# Patient Record
Sex: Female | Born: 1935 | Race: White | Hispanic: No | Smoking: Never smoker
Health system: Southern US, Community
[De-identification: ages and names within clinical notes are randomized; demographics above are authoritative.]

## PROBLEM LIST (undated history)

## (undated) DIAGNOSIS — F32A Depression, unspecified: Secondary | ICD-10-CM

## (undated) DIAGNOSIS — F329 Major depressive disorder, single episode, unspecified: Secondary | ICD-10-CM

## (undated) DIAGNOSIS — H9190 Unspecified hearing loss, unspecified ear: Secondary | ICD-10-CM

## (undated) DIAGNOSIS — I10 Essential (primary) hypertension: Secondary | ICD-10-CM

## (undated) DIAGNOSIS — F0390 Unspecified dementia without behavioral disturbance: Secondary | ICD-10-CM

## (undated) HISTORY — PX: PACEMAKER PLACEMENT: SHX43

---
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
## 2018-07-18 ENCOUNTER — Emergency Department (HOSPITAL_COMMUNITY): Payer: Medicare Other

## 2018-07-18 ENCOUNTER — Observation Stay (HOSPITAL_COMMUNITY)
Admission: EM | Admit: 2018-07-18 | Discharge: 2018-07-22 | Disposition: A | Discharge status: Home / Self Care | Payer: Medicare Other | Attending: Internal Medicine | Admitting: Internal Medicine

## 2018-07-18 ENCOUNTER — Other Ambulatory Visit: Payer: Self-pay

## 2018-07-18 ENCOUNTER — Encounter (HOSPITAL_COMMUNITY): Payer: Self-pay

## 2018-07-18 DIAGNOSIS — G9341 Metabolic encephalopathy: Secondary | ICD-10-CM | POA: Diagnosis not present

## 2018-07-18 DIAGNOSIS — I129 Hypertensive chronic kidney disease with stage 1 through stage 4 chronic kidney disease, or unspecified chronic kidney disease: Secondary | ICD-10-CM | POA: Diagnosis not present

## 2018-07-18 DIAGNOSIS — R410 Disorientation, unspecified: Secondary | ICD-10-CM | POA: Diagnosis not present

## 2018-07-18 DIAGNOSIS — Z66 Do not resuscitate: Secondary | ICD-10-CM | POA: Diagnosis not present

## 2018-07-18 DIAGNOSIS — R630 Anorexia: Secondary | ICD-10-CM | POA: Insufficient documentation

## 2018-07-18 DIAGNOSIS — R262 Difficulty in walking, not elsewhere classified: Secondary | ICD-10-CM | POA: Insufficient documentation

## 2018-07-18 DIAGNOSIS — F329 Major depressive disorder, single episode, unspecified: Secondary | ICD-10-CM | POA: Diagnosis not present

## 2018-07-18 DIAGNOSIS — W19XXXA Unspecified fall, initial encounter: Secondary | ICD-10-CM

## 2018-07-18 DIAGNOSIS — M6281 Muscle weakness (generalized): Secondary | ICD-10-CM | POA: Insufficient documentation

## 2018-07-18 DIAGNOSIS — F0390 Unspecified dementia without behavioral disturbance: Secondary | ICD-10-CM

## 2018-07-18 DIAGNOSIS — N390 Urinary tract infection, site not specified: Secondary | ICD-10-CM | POA: Diagnosis present

## 2018-07-18 DIAGNOSIS — N182 Chronic kidney disease, stage 2 (mild): Secondary | ICD-10-CM | POA: Insufficient documentation

## 2018-07-18 DIAGNOSIS — N3001 Acute cystitis with hematuria: Secondary | ICD-10-CM | POA: Diagnosis not present

## 2018-07-18 DIAGNOSIS — Z950 Presence of cardiac pacemaker: Secondary | ICD-10-CM | POA: Diagnosis not present

## 2018-07-18 DIAGNOSIS — X58XXXD Exposure to other specified factors, subsequent encounter: Secondary | ICD-10-CM | POA: Diagnosis not present

## 2018-07-18 DIAGNOSIS — Z9181 History of falling: Secondary | ICD-10-CM | POA: Diagnosis not present

## 2018-07-18 DIAGNOSIS — I10 Essential (primary) hypertension: Secondary | ICD-10-CM

## 2018-07-18 DIAGNOSIS — R1310 Dysphagia, unspecified: Secondary | ICD-10-CM | POA: Diagnosis not present

## 2018-07-18 DIAGNOSIS — Z888 Allergy status to other drugs, medicaments and biological substances status: Secondary | ICD-10-CM | POA: Insufficient documentation

## 2018-07-18 DIAGNOSIS — R110 Nausea: Secondary | ICD-10-CM | POA: Insufficient documentation

## 2018-07-18 DIAGNOSIS — N179 Acute kidney failure, unspecified: Secondary | ICD-10-CM | POA: Insufficient documentation

## 2018-07-18 DIAGNOSIS — S52021D Displaced fracture of olecranon process without intraarticular extension of right ulna, subsequent encounter for closed fracture with routine healing: Secondary | ICD-10-CM | POA: Diagnosis not present

## 2018-07-18 DIAGNOSIS — S52021A Displaced fracture of olecranon process without intraarticular extension of right ulna, initial encounter for closed fracture: Secondary | ICD-10-CM | POA: Diagnosis present

## 2018-07-18 DIAGNOSIS — M62838 Other muscle spasm: Secondary | ICD-10-CM | POA: Diagnosis not present

## 2018-07-18 DIAGNOSIS — Z79899 Other long term (current) drug therapy: Secondary | ICD-10-CM | POA: Insufficient documentation

## 2018-07-18 DIAGNOSIS — H9190 Unspecified hearing loss, unspecified ear: Secondary | ICD-10-CM

## 2018-07-18 HISTORY — DX: Unspecified dementia, unspecified severity, without behavioral disturbance, psychotic disturbance, mood disturbance, and anxiety: F03.90

## 2018-07-18 HISTORY — DX: Essential (primary) hypertension: I10

## 2018-07-18 HISTORY — DX: Unspecified dementia without behavioral disturbance: F03.90

## 2018-07-18 HISTORY — DX: Unspecified hearing loss, unspecified ear: H91.90

## 2018-07-18 LAB — URINALYSIS, ROUTINE W REFLEX MICROSCOPIC
Bilirubin Urine: NEGATIVE
Glucose, UA: NEGATIVE mg/dL
Hgb urine dipstick: NEGATIVE
Ketones, ur: NEGATIVE mg/dL
Nitrite: POSITIVE — AB
Protein, ur: 30 mg/dL — AB
Specific Gravity, Urine: 1.02 (ref 1.005–1.030)
WBC, UA: >50 WBC/hpf — ABNORMAL HIGH (ref 0–5)
pH: 5 (ref 5.0–8.0)

## 2018-07-18 LAB — BASIC METABOLIC PANEL
Anion gap: 9 (ref 5–15)
BUN: 21 mg/dL (ref 8–23)
CO2: 27 mmol/L (ref 22–32)
CREATININE: 1.12 mg/dL — AB (ref 0.44–1.00)
Calcium: 9.5 mg/dL (ref 8.9–10.3)
Chloride: 108 mmol/L (ref 98–111)
GFR calc Af Amer: 50 mL/min — ABNORMAL LOW (ref >60)
GFR calc non Af Amer: 43 mL/min — ABNORMAL LOW (ref >60)
Glucose, Bld: 112 mg/dL — ABNORMAL HIGH (ref 70–99)
Potassium: 4.4 mmol/L (ref 3.5–5.1)
Sodium: 144 mmol/L (ref 135–145)

## 2018-07-18 LAB — CBC
HCT: 43.8 % (ref 36.0–46.0)
Hemoglobin: 13.6 g/dL (ref 12.0–15.0)
MCH: 31.8 pg (ref 26.0–34.0)
MCHC: 31.1 g/dL (ref 30.0–36.0)
MCV: 102.3 fL — ABNORMAL HIGH (ref 80.0–100.0)
Platelets: 347 10*3/uL (ref 150–400)
RBC: 4.28 MIL/uL (ref 3.87–5.11)
RDW: 12.9 % (ref 11.5–15.5)
WBC: 13.1 10*3/uL — ABNORMAL HIGH (ref 4.0–10.5)
nRBC: 0 % (ref 0.0–0.2)

## 2018-07-18 MED ORDER — ONDANSETRON HCL 4 MG/2ML IJ SOLN: 4.0000 mg | Freq: Four times a day (QID) | INTRAMUSCULAR | Status: DC | PRN

## 2018-07-18 MED ORDER — ATENOLOL 25 MG PO TABS
25.0000 mg | ORAL_TABLET | Freq: Every day | ORAL | Status: DC
  Administered 2018-07-19 – 2018-07-22 (×4): 25 mg via ORAL
  Filled 2018-07-18 (×5): qty 1

## 2018-07-18 MED ORDER — SENNOSIDES-DOCUSATE SODIUM 8.6-50 MG PO TABS: 1.0000 | ORAL_TABLET | Freq: Every evening | ORAL | Status: DC | PRN

## 2018-07-18 MED ORDER — ONDANSETRON HCL 4 MG PO TABS: 4.0000 mg | ORAL_TABLET | Freq: Four times a day (QID) | ORAL | Status: DC | PRN

## 2018-07-18 MED ORDER — HEPARIN SODIUM (PORCINE) 5000 UNIT/ML IJ SOLN
5000.0000 [IU] | Freq: Three times a day (TID) | INTRAMUSCULAR | Status: DC
  Administered 2018-07-18 – 2018-07-22 (×10): 5000 [IU] via SUBCUTANEOUS
  Filled 2018-07-18 (×9): qty 1

## 2018-07-18 MED ORDER — PAROXETINE HCL 20 MG PO TABS
30.0000 mg | ORAL_TABLET | Freq: Every day | ORAL | Status: DC
  Administered 2018-07-19 – 2018-07-22 (×4): 30 mg via ORAL
  Filled 2018-07-18 (×5): qty 1

## 2018-07-18 MED ORDER — SODIUM CHLORIDE 0.9 % IV SOLN
1.0000 g | Freq: Once | INTRAVENOUS | Status: AC
  Administered 2018-07-18: 1 g via INTRAVENOUS
  Filled 2018-07-18: qty 10

## 2018-07-18 MED ORDER — ENSURE MAX PROTEIN PO LIQD
11.0000 [oz_av] | Freq: Every day | ORAL | Status: DC
  Administered 2018-07-18 – 2018-07-22 (×5): 11 [oz_av] via ORAL
  Filled 2018-07-18: qty 330

## 2018-07-18 MED ORDER — DOXYLAMINE SUCCINATE (SLEEP) 25 MG PO TABS
1.0000 | ORAL_TABLET | Freq: Every day | ORAL | Status: DC
  Administered 2018-07-18 – 2018-07-21 (×4): 25 mg via ORAL
  Filled 2018-07-18 (×4): qty 1

## 2018-07-18 MED ORDER — SODIUM CHLORIDE 0.9 % IV BOLUS
500.0000 mL | Freq: Once | INTRAVENOUS | Status: AC
  Administered 2018-07-18: 500 mL via INTRAVENOUS

## 2018-07-18 MED ORDER — METHOCARBAMOL 500 MG PO TABS: 500.0000 mg | ORAL_TABLET | Freq: Three times a day (TID) | ORAL | Status: DC | PRN

## 2018-07-18 MED ORDER — SODIUM CHLORIDE 0.9 % IV SOLN
INTRAVENOUS | Status: DC
  Administered 2018-07-18 – 2018-07-21 (×4): via INTRAVENOUS

## 2018-07-18 MED ORDER — ACETAMINOPHEN 650 MG RE SUPP: 650.0000 mg | Freq: Four times a day (QID) | RECTAL | Status: DC | PRN

## 2018-07-18 MED ORDER — FENTANYL CITRATE (PF) 100 MCG/2ML IJ SOLN
25.0000 ug | Freq: Once | INTRAMUSCULAR | Status: AC
  Administered 2018-07-18: 25 ug via INTRAMUSCULAR
  Filled 2018-07-18: qty 2

## 2018-07-18 MED ORDER — ACETAMINOPHEN 325 MG PO TABS
650.0000 mg | ORAL_TABLET | Freq: Four times a day (QID) | ORAL | Status: DC | PRN
  Administered 2018-07-21: 650 mg via ORAL
  Filled 2018-07-18: qty 2

## 2018-07-18 MED ORDER — SODIUM CHLORIDE 0.9 % IV SOLN
1.0000 g | INTRAVENOUS | Status: DC
  Administered 2018-07-19 – 2018-07-21 (×3): 1 g via INTRAVENOUS
  Filled 2018-07-18 (×3): qty 1

## 2018-07-18 MED ORDER — OXYCODONE-ACETAMINOPHEN 5-325 MG PO TABS
1.0000 | ORAL_TABLET | ORAL | Status: DC | PRN
  Administered 2018-07-20 (×2): 1 via ORAL
  Filled 2018-07-18 (×2): qty 1

## 2018-07-18 MED ORDER — HYDRALAZINE HCL 20 MG/ML IJ SOLN: 5.0000 mg | INTRAMUSCULAR | Status: DC | PRN

## 2018-07-18 NOTE — ED Triage Notes (Signed)
Patient arrived via [facility] from home. Patient is currently at baseline per family at scene due to patient having Hx of dementia, and difficulty hearing. Patient has right elbow injury, unsure if patient had fall. Per family, home health was at home and gave bath and no bruising or pain noted. Patient last night had decrease in appetite and has had about 4 insure drinks since then. Patient also has pain in right elbow and bruising.

## 2018-07-18 NOTE — ED Provider Notes (Signed)
Lives at home w/ home health, at baseline pt is ambulatory. Family noticed she wasn't using her right arm as much, and had decreased appetite. Family unsure of any falls or trauma and pt demented and unable to contribute to history. EMS noted that toilet paper holder was at the level of the elbow in the home and wondered if that could be the source of trauma.   X-rays of humerus and forearm ordered by prior care team, CT head and neck added as well given pt nonverbal and unsure of trauma.  Thus far imaging shows an olecranon process fracture.  Plan: Follow-up additional images and then discussed with orthopedics.  Labs Reviewed  BASIC METABOLIC PANEL - Abnormal; Notable for the following components:      Result Value   Glucose, Bld 112 (*)    Creatinine, Ser 1.12 (*)    GFR calc non Af Amer 43 (*)    GFR calc Af Amer 50 (*)    All other components within normal limits  CBC - Abnormal; Notable for the following components:   WBC 13.1 (*)    MCV 102.3 (*)    All other components within normal limits  URINALYSIS, ROUTINE W REFLEX MICROSCOPIC - Abnormal; Notable for the following components:   Color, Urine AMBER (*)    APPearance CLOUDY (*)    Protein, ur 30 (*)    Nitrite POSITIVE (*)    Leukocytes,Ua LARGE (*)    WBC, UA >50 (*)    Bacteria, UA MANY (*)    Non Squamous Epithelial 0-5 (*)    All other components within normal limits    Dg Elbow Complete Right  Result Date: 07/18/2018 CLINICAL DATA:  History of dementia, now with right elbow pain and edema. EXAM: RIGHT ELBOW - COMPLETE 3+ VIEW COMPARISON:  Right humerus and forearm radiographs-earlier same day FINDINGS: There is an acute displaced avulsion fracture of the olecranon process with associated foreshortening and adjacent soft tissue swelling. No additional fractures identified given obliquity. No radiopaque foreign body. IMPRESSION: 1. Acute minimally displaced avulsion fracture of the olecranon process with associated  foreshortening. 2. No additional fractures identified. Electronically Signed   By: Simonne Come M.D.   On: 07/18/2018 16:28   Dg Forearm Right  Result Date: 07/18/2018 CLINICAL DATA:  History of dementia, now with right upper extremity edema. EXAM: RIGHT FOREARM - 2 VIEW COMPARISON:  None. FINDINGS: There is an acute minimally displaced avulsion fracture involving the olecranon process with associated foreshortening and adjacent soft tissue swelling. No radiopaque foreign body. No additional fracture identified within the mid and distal aspects of the radius or ulna. Mild degenerative change involving the STT joints of the base of the thumb, incompletely evaluated. IMPRESSION: Acute, minimally displaced avulsion fracture of the olecranon process. Further evaluation with dedicated elbow radiographs could be performed as indicated. Electronically Signed   By: Simonne Come M.D.   On: 07/18/2018 15:41   Ct Head Wo Contrast  Result Date: 07/18/2018 CLINICAL DATA:  Trauma EXAM: CT HEAD WITHOUT CONTRAST CT CERVICAL SPINE WITHOUT CONTRAST TECHNIQUE: Multidetector CT imaging of the head and cervical spine was performed following the standard protocol without intravenous contrast. Multiplanar CT image reconstructions of the cervical spine were also generated. COMPARISON:  None. FINDINGS: CT HEAD FINDINGS Brain: No evidence of acute infarction, hemorrhage, hydrocephalus, extra-axial collection or mass lesion/mass effect. Periventricular white matter disease and global volume loss. Vascular: No hyperdense vessel or unexpected calcification. Skull: Normal. Negative for fracture or focal lesion.  Sinuses/Orbits: No acute finding. Other: None. CT CERVICAL SPINE FINDINGS Alignment: Normal. Skull base and vertebrae: There is a subtle, likely acute superior endplate deformity of C7 without significant fracture fragment pulsion. No primary bone lesion or focal pathologic process. Moderate to severe multilevel facet degenerative  disease. Soft tissues and spinal canal: No prevertebral fluid or swelling. No visible canal hematoma. Disc levels: Moderate multilevel disc and facet degenerative disease, worst at C3 through C5. Upper chest: Negative. Other: None. IMPRESSION: 1. No acute intracranial pathology. Small-vessel white matter disease and volume loss in keeping with advanced patient age. 2. Subtle, likely acute superior endplate deformity of C7 without significant fracture fragment pulsion. Correlate for referable pain. MRI may be used to further evaluate for marrow edema and fracture acuity if desired. Electronically Signed   By: Lauralyn PrimesAlex  Bibbey M.D.   On: 07/18/2018 16:24   Ct Cervical Spine Wo Contrast  Result Date: 07/18/2018 CLINICAL DATA:  Trauma EXAM: CT HEAD WITHOUT CONTRAST CT CERVICAL SPINE WITHOUT CONTRAST TECHNIQUE: Multidetector CT imaging of the head and cervical spine was performed following the standard protocol without intravenous contrast. Multiplanar CT image reconstructions of the cervical spine were also generated. COMPARISON:  None. FINDINGS: CT HEAD FINDINGS Brain: No evidence of acute infarction, hemorrhage, hydrocephalus, extra-axial collection or mass lesion/mass effect. Periventricular white matter disease and global volume loss. Vascular: No hyperdense vessel or unexpected calcification. Skull: Normal. Negative for fracture or focal lesion. Sinuses/Orbits: No acute finding. Other: None. CT CERVICAL SPINE FINDINGS Alignment: Normal. Skull base and vertebrae: There is a subtle, likely acute superior endplate deformity of C7 without significant fracture fragment pulsion. No primary bone lesion or focal pathologic process. Moderate to severe multilevel facet degenerative disease. Soft tissues and spinal canal: No prevertebral fluid or swelling. No visible canal hematoma. Disc levels: Moderate multilevel disc and facet degenerative disease, worst at C3 through C5. Upper chest: Negative. Other: None. IMPRESSION: 1.  No acute intracranial pathology. Small-vessel white matter disease and volume loss in keeping with advanced patient age. 2. Subtle, likely acute superior endplate deformity of C7 without significant fracture fragment pulsion. Correlate for referable pain. MRI may be used to further evaluate for marrow edema and fracture acuity if desired. Electronically Signed   By: Lauralyn PrimesAlex  Bibbey M.D.   On: 07/18/2018 16:24   Dg Chest Portable 1 View  Result Date: 07/18/2018 CLINICAL DATA:  Weakness EXAM: PORTABLE CHEST 1 VIEW COMPARISON:  None. FINDINGS: Two lead left subclavian pacemaker is noted with lead tips overlying the right atrium and right ventricle. Mild cardiomegaly. Minimally tortuous atherosclerotic thoracic aorta. Otherwise normal mediastinal contour. No pneumothorax. No pleural effusion. Lungs appear clear, with no acute consolidative airspace disease and no pulmonary edema. IMPRESSION: Mild cardiomegaly without pulmonary edema. No active pulmonary disease. Electronically Signed   By: Delbert PhenixJason A Poff M.D.   On: 07/18/2018 20:04   Dg Humerus Right  Result Date: 07/18/2018 CLINICAL DATA:  History of dementia, now with deformity involving the right arm. EXAM: RIGHT HUMERUS - 2+ VIEW COMPARISON:  Right forearm radiographs-earlier same day FINDINGS: There is an acute minimally displaced avulsion fracture involving the olecranon process with associated foreshortening and adjacent soft tissue swelling. No radiopaque foreign body. No additional fracture identified within the proximal humerus. IMPRESSION: Acute, minimally displaced avulsion fracture of the olecranon process. Further evaluation with dedicated elbow radiographs could be performed as indicated. Electronically Signed   By: Simonne ComeJohn  Watts M.D.   On: 07/18/2018 15:40    MDM  Patient presents to the emergency  department for evaluation of right arm pain found to have an acutely displaced olecranon process fracture, no other fractures noted within the right arm.   Given that it is unclear how patient sustained trauma CT of the cervical spine and head ordered as well.  C-spine shows a subtle endplate deformity of C7 without any significant fracture fragment potion, patient does not have focal tenderness in this area and is moving all extremities do not feel that this will require further evaluation.  No acute intracranial pathology noted.  Olecranon process fracture on dominant arm will likely have severe impact on patient's functioning she is demented at baseline but uses this arm for eating and to help transfer.  Case discussed with Dr. [name] with orthopedics who recommends placing arm in a posterior splint at 90 degrees with sling.  Not recommend operative management at this time.  Discussed results with patient's son-in-law who is at the bedside, I do not feel that patient would be safe to go back home as she will need significantly increased assistance they have home health at home but they are only there intermittently to check on patient.  Will check basic labs and urinalysis to assess for any source of infection causing increased weakness and fall risk if this is found patient can be admitted but otherwise patient will need social work consult for SNF placement.  Discussed with patient's daughter over the phone as well who is in agreement with this plan.  Patient's daughter is her primary caretaker is out of town due to a death in the family.  Labs show a mild leukocytosis of 13.1, normal hemoglobin, aside from glucose of 112 no acute electrolyte derangements, creatinine of 1.12, no prior available for comparison.  Urinalysis consistent with infection shows positive nitrates and leukocytes with greater than 50 WBCs and many bacteria, this was collected via I&O cath.  Urine culture collected.  Will start patient on IV Rocephin and plan for hospitalist admission.  Updated Dr. [name] on plan for admission he will plan to see the patient during rounds tomorrow  morning but at this time does not recommend surgical intervention given patient's age, she has many risk factors for not recovering well from operative management.  Case discussed with Dr. Clyde Lundborg [facility] hospitalist who will see and admit the patient.  Case discussed with patient's son-in-law who is in agreement with this plan.  Final diagnoses:  Closed fracture of right olecranon process, initial encounter  Acute cystitis with hematuria       Dartha Lodge, PA-C 07/18/18 2341    Mancel Bale, MD 07/19/18 [phone]

## 2018-07-18 NOTE — H&P (Signed)
History and Physical    Casey Lam RFX:[id number] DOB: [birth year] DOA: 07/18/2018  Referring MD/NP/PA:   PCP: [name], MD   Patient coming from:  The patient is coming from home.  At baseline, pt is dependent for most of ADL.        Chief Complaint: right elbow pain  HPI: Casey Lam is a 85 y.o. female with medical history significant of hypertension, depression, dementia, CKD stage II, who presents with right elbow pain.  Per his son-in law, pt lives at home with family, with daily home health help. At baseline, patient knows family members, but most of the time is not orientated to the place and time.  Most of time she is ambulatory. Pt was noted to have right elbow pain today. She is not using her right arm as much. She also has decreased appetite and is  more disorientated.  Not sure if patient fell, but possible. EMS noted that toilet paper holder was at the level of the elbow in the home and wondered if that could be the source of trauma. Pt is using diaper.  Not sure if she has any symptoms for UTI.  She does not seem to have chest pain, cough, shortness of breath.  No active nausea vomiting, diarrhea noted.  Does not seem to have abdominal pain.  ED Course: pt was found to have WBC 13.1, positive urinalysis for UTI (cloudy appearance, large amount of leukocyte, positive nitrite, many bacteria, WBC 50), worsening renal function, temperature normal, no tachycardia, oxygen saturation 90 to 96% on room air.  Chest x-ray with cardiomegaly without infiltration.  CT head is negative for acute intracranial abnormalities.  X-ray of right elbow, right forearm and right humerus showed acute minimally displaced avulsion fracture of her left olecranon process.  Patient is admitted to MedSurg bed for observation.  Orthopedic surgeon, Dr. [name] was consulted by EDP.  CT of C-spin showed: subtle, likely acute superior endplate deformity of C7 without significant fracture fragment  pulsion.    Review of Systems: Could not be reviewed due to dementia  Allergy:  Allergies  Allergen Reactions  . Lorazepam Other (See Comments)    Past Medical History:  Diagnosis Date  . Dementia (HCC) 07/18/2018  . Hearing loss 07/18/2018  . Hypertension 07/18/2018    Past Surgical History:  Procedure Laterality Date  . PACEMAKER PLACEMENT      Social History:  reports that she has never smoked. She has never used smokeless tobacco. She reports previous alcohol use. She reports that she does not use drugs.  Family History:  Family History  Problem Relation Age of Onset  . Dementia Sister   . Dementia Brother      Prior to Admission medications   Medication Sig Start Date End Date Taking? Authorizing Provider  atenolol (TENORMIN) 25 MG tablet Take 25 mg by mouth daily. 05/17/18  Yes [provider]  doxylamine, Sleep, (UNISOM) 25 MG tablet Take 1 tablet by mouth at bedtime.   Yes [provider]  PARoxetine (PAXIL) 30 MG tablet Take 30 mg by mouth daily. 05/17/18  Yes [provider]    Physical Exam: Vitals:   07/18/18 1900 07/18/18 1905 07/18/18 1930 07/18/18 2100  BP: 127/86 127/86 132/61 126/82  Pulse: 80 80 83 77  Resp:  18 17 17   Temp:   97.8 F (36.6 C) 97.8 F (36.6 C)  TempSrc:   Axillary Axillary  SpO2: 93% 93% 96% 96%  Weight:  Height:       General: Not in acute distress HEENT:       Eyes: PERRL, EOMI, no scleral icterus.       ENT: No discharge from the ears and nose, no pharynx injection, no tonsillar enlargement.        Neck: No JVD, no bruit, no mass felt. Heme: No neck lymph node enlargement. Cardiac: S1/S2, RRR, No murmurs, No gallops or rubs. Respiratory: No rales, wheezing, rhonchi or rubs. GI: Soft, nondistended, nontender, no rebound pain, no organomegaly, BS present. GU: No hematuria Ext: No pitting leg edema bilaterally. 2+DP/PT pulse bilaterally. Musculoskeletal: has tenderness in right elbow, splint  was placed.  Skin: No rashes.  Neuro: Alert, not oriented X3, cranial nerves II-XII grossly intact, moves all extremities. Psych: Patient is not psychotic, no suicidal or hemocidal ideation.  Labs on Admission: I have personally reviewed following labs and imaging studies  CBC: Recent Labs  Lab 07/18/18 1710  WBC 13.1*  HGB 13.6  HCT 43.8  MCV 102.3*  PLT 347   Basic Metabolic Panel: Recent Labs  Lab 07/18/18 1710  NA 144  K 4.4  CL 108  CO2 27  GLUCOSE 112*  BUN 21  CREATININE 1.12*  CALCIUM 9.5   GFR: Estimated Creatinine Clearance: 36.7 mL/min (A) (by C-G formula based on SCr of 1.12 mg/dL (H)). Liver Function Tests: No results for input(s): AST, ALT, ALKPHOS, BILITOT, PROT, ALBUMIN in the last 168 hours. No results for input(s): LIPASE, AMYLASE in the last 168 hours. No results for input(s): AMMONIA in the last 168 hours. Coagulation Profile: No results for input(s): INR, PROTIME in the last 168 hours. Cardiac Enzymes: No results for input(s): CKTOTAL, CKMB, CKMBINDEX, TROPONINI in the last 168 hours. BNP (last 3 results) No results for input(s): PROBNP in the last 8760 hours. HbA1C: No results for input(s): HGBA1C in the last 72 hours. CBG: No results for input(s): GLUCAP in the last 168 hours. Lipid Profile: No results for input(s): CHOL, HDL, LDLCALC, TRIG, CHOLHDL, LDLDIRECT in the last 72 hours. Thyroid Function Tests: No results for input(s): TSH, T4TOTAL, FREET4, T3FREE, THYROIDAB in the last 72 hours. Anemia Panel: No results for input(s): VITAMINB12, FOLATE, FERRITIN, TIBC, IRON, RETICCTPCT in the last 72 hours. Urine analysis:    Component Value Date/Time   COLORURINE AMBER (A) 07/18/2018 1710   APPEARANCEUR CLOUDY (A) 07/18/2018 1710   LABSPEC 1.020 07/18/2018 1710   PHURINE 5.0 07/18/2018 1710   GLUCOSEU NEGATIVE 07/18/2018 1710   HGBUR NEGATIVE 07/18/2018 1710   BILIRUBINUR NEGATIVE 07/18/2018 1710   KETONESUR NEGATIVE 07/18/2018 1710    PROTEINUR 30 (A) 07/18/2018 1710   NITRITE POSITIVE (A) 07/18/2018 1710   LEUKOCYTESUR LARGE (A) 07/18/2018 1710   Sepsis Labs: @LABRCNTIP (procalcitonin:4,lacticidven:4) )No results found for this or any previous visit (from the past 240 hour(s)).   Radiological Exams on Admission: Dg Elbow Complete Right  Result Date: 07/18/2018 CLINICAL DATA:  History of dementia, now with right elbow pain and edema. EXAM: RIGHT ELBOW - COMPLETE 3+ VIEW COMPARISON:  Right humerus and forearm radiographs-earlier same day FINDINGS: There is an acute displaced avulsion fracture of the olecranon process with associated foreshortening and adjacent soft tissue swelling. No additional fractures identified given obliquity. No radiopaque foreign body. IMPRESSION: 1. Acute minimally displaced avulsion fracture of the olecranon process with associated foreshortening. 2. No additional fractures identified. Electronically Signed   By: Simonne Come M.D.   On: 07/18/2018 16:28   Dg Forearm Right  Result Date:  07/18/2018 CLINICAL DATA:  History of dementia, now with right upper extremity edema. EXAM: RIGHT FOREARM - 2 VIEW COMPARISON:  None. FINDINGS: There is an acute minimally displaced avulsion fracture involving the olecranon process with associated foreshortening and adjacent soft tissue swelling. No radiopaque foreign body. No additional fracture identified within the mid and distal aspects of the radius or ulna. Mild degenerative change involving the STT joints of the base of the thumb, incompletely evaluated. IMPRESSION: Acute, minimally displaced avulsion fracture of the olecranon process. Further evaluation with dedicated elbow radiographs could be performed as indicated. Electronically Signed   By: Simonne Come M.D.   On: 07/18/2018 15:41   Ct Head Wo Contrast  Result Date: 07/18/2018 CLINICAL DATA:  Trauma EXAM: CT HEAD WITHOUT CONTRAST CT CERVICAL SPINE WITHOUT CONTRAST TECHNIQUE: Multidetector CT imaging of the head  and cervical spine was performed following the standard protocol without intravenous contrast. Multiplanar CT image reconstructions of the cervical spine were also generated. COMPARISON:  None. FINDINGS: CT HEAD FINDINGS Brain: No evidence of acute infarction, hemorrhage, hydrocephalus, extra-axial collection or mass lesion/mass effect. Periventricular white matter disease and global volume loss. Vascular: No hyperdense vessel or unexpected calcification. Skull: Normal. Negative for fracture or focal lesion. Sinuses/Orbits: No acute finding. Other: None. CT CERVICAL SPINE FINDINGS Alignment: Normal. Skull base and vertebrae: There is a subtle, likely acute superior endplate deformity of C7 without significant fracture fragment pulsion. No primary bone lesion or focal pathologic process. Moderate to severe multilevel facet degenerative disease. Soft tissues and spinal canal: No prevertebral fluid or swelling. No visible canal hematoma. Disc levels: Moderate multilevel disc and facet degenerative disease, worst at C3 through C5. Upper chest: Negative. Other: None. IMPRESSION: 1. No acute intracranial pathology. Small-vessel white matter disease and volume loss in keeping with advanced patient age. 2. Subtle, likely acute superior endplate deformity of C7 without significant fracture fragment pulsion. Correlate for referable pain. MRI may be used to further evaluate for marrow edema and fracture acuity if desired. Electronically Signed   By: Lauralyn Primes M.D.   On: 07/18/2018 16:24   Ct Cervical Spine Wo Contrast  Result Date: 07/18/2018 CLINICAL DATA:  Trauma EXAM: CT HEAD WITHOUT CONTRAST CT CERVICAL SPINE WITHOUT CONTRAST TECHNIQUE: Multidetector CT imaging of the head and cervical spine was performed following the standard protocol without intravenous contrast. Multiplanar CT image reconstructions of the cervical spine were also generated. COMPARISON:  None. FINDINGS: CT HEAD FINDINGS Brain: No evidence of acute  infarction, hemorrhage, hydrocephalus, extra-axial collection or mass lesion/mass effect. Periventricular white matter disease and global volume loss. Vascular: No hyperdense vessel or unexpected calcification. Skull: Normal. Negative for fracture or focal lesion. Sinuses/Orbits: No acute finding. Other: None. CT CERVICAL SPINE FINDINGS Alignment: Normal. Skull base and vertebrae: There is a subtle, likely acute superior endplate deformity of C7 without significant fracture fragment pulsion. No primary bone lesion or focal pathologic process. Moderate to severe multilevel facet degenerative disease. Soft tissues and spinal canal: No prevertebral fluid or swelling. No visible canal hematoma. Disc levels: Moderate multilevel disc and facet degenerative disease, worst at C3 through C5. Upper chest: Negative. Other: None. IMPRESSION: 1. No acute intracranial pathology. Small-vessel white matter disease and volume loss in keeping with advanced patient age. 2. Subtle, likely acute superior endplate deformity of C7 without significant fracture fragment pulsion. Correlate for referable pain. MRI may be used to further evaluate for marrow edema and fracture acuity if desired. Electronically Signed   By: Erasmo Score.D.  On: 07/18/2018 16:24   Dg Chest Portable 1 View  Result Date: 07/18/2018 CLINICAL DATA:  Weakness EXAM: PORTABLE CHEST 1 VIEW COMPARISON:  None. FINDINGS: Two lead left subclavian pacemaker is noted with lead tips overlying the right atrium and right ventricle. Mild cardiomegaly. Minimally tortuous atherosclerotic thoracic aorta. Otherwise normal mediastinal contour. No pneumothorax. No pleural effusion. Lungs appear clear, with no acute consolidative airspace disease and no pulmonary edema. IMPRESSION: Mild cardiomegaly without pulmonary edema. No active pulmonary disease. Electronically Signed   By: Delbert PhenixJason A Poff M.D.   On: 07/18/2018 20:04   Dg Humerus Right  Result Date: 07/18/2018 CLINICAL DATA:   History of dementia, now with deformity involving the right arm. EXAM: RIGHT HUMERUS - 2+ VIEW COMPARISON:  Right forearm radiographs-earlier same day FINDINGS: There is an acute minimally displaced avulsion fracture involving the olecranon process with associated foreshortening and adjacent soft tissue swelling. No radiopaque foreign body. No additional fracture identified within the proximal humerus. IMPRESSION: Acute, minimally displaced avulsion fracture of the olecranon process. Further evaluation with dedicated elbow radiographs could be performed as indicated. Electronically Signed   By: Simonne ComeJohn  Watts M.D.   On: 07/18/2018 15:40     EKG: Not done in ED, will get one.   Assessment/Plan Principal Problem:   Fracture of olecranon process of right ulna Active Problems:   Dementia (HCC)   Hypertension   UTI (urinary tract infection)   Acute renal failure superimposed on stage 2 chronic kidney disease (HCC)   Fall   Fracture of olecranon process of right ulna: As evidenced by x-ray. No neurovascular compromise. Orthopedic surgeon, Dr. Magnus IvanBlackman was consulted by EDP. Most likely pt will not have surgery per EDP's talk with surgeon given high risk of complication. They will see pt in AM. I will place pt as observation status.  - will place on Med-surg bed for obs - Pain control: prn percocet - When necessary Zofran for nausea - Robaxin for muscle spasm - PT/OT - CM and SW consult for rehab placement - Keep pt on NPO after MN, just in case patient needs surgery  Possibil fall: CT of C-spin showed subtle, likely acute superior endplate deformity of C7 without significant fracture fragment pulsion. Pt cannot do MRI due to presence of pacemaker.  -will put on soft C Collar -PT/OT  Dementia (HCC): No behavior change.  More disorientated, likely due to pain and UTI. -Frequent neuro check  UTI (urinary tract infection): -IV rocephin -f/u Bx and Ux  Acute renal failure superimposed on stage  2 chronic kidney disease (HCC): Baseline creatinine 0.87 on 12/29/2017.  Her creatinine is 1.12, BUN 21. -IV fluid: 500 cc normal saline, followed by 75 cc/h - Follow-up renal function by BMP  HTN:  -Continue home medications: Atenolol -IV hydralazine prn   DVT ppx: SQ Heparin   Code Status: DNR (her son-in law has DNR documentation) Family Communication:   Yes, patient's  Son-in law  at bed side Disposition Plan:  Anticipate discharge to rehab facility Consults called:  Dr. Magnus IvanBlackman of orthopedic surgeon Admission status:   medical floor/obs       Date of Service 07/18/2018    Lorretta HarpXilin Lylie Blacklock [facility]   If 7PM-7AM, please contact night-coverage [email or web address] Password [email or web address] 07/18/2018, 9:25 PM

## 2018-07-18 NOTE — ED Notes (Signed)
ED TO INPATIENT HANDOFF REPORT  ED Nurse Name and Phone #: Charissa Bash [phone]  S Name/Age/Gender Casey Lam 85 y.o. female Room/Bed: WA21/WA21  Code Status   Code Status: DNR  Home/SNF/Other Skilled nursing facility Patient oriented to: self Is this baseline? Yes   Triage Complete: Triage complete  Chief Complaint Urinary Issue/Pain  Triage Note Patient arrived via [facility] from home. Patient is currently at baseline per family at scene due to patient having Hx of dementia, and difficulty hearing. Patient has right elbow injury, unsure if patient had fall. Per family, home health was at home and gave bath and no bruising or pain noted. Patient last night had decrease in appetite and has had about 4 insure drinks since then. Patient also has pain in right elbow and bruising.    Allergies Allergies  Allergen Reactions  . Lorazepam Other (See Comments)    Level of Care/Admitting Diagnosis ED Disposition    ED Disposition Condition Comment   Admit  Hospital Area: [facility] [[id number]]  Level of Care: Med-Surg [16]  Diagnosis: Fracture of olecranon process of right ulna [[id number]]  Admitting Physician: Lorretta Harp [[id number]]  Attending Physician: Lorretta Harp [[id number]]  PT Class (Do Not Modify): Observation [104]  PT Acc Code (Do Not Modify): Observation [10022]       B Medical/Surgery History Past Medical History:  Diagnosis Date  . Dementia (HCC) 07/18/2018  . Hearing loss 07/18/2018  . Hypertension 07/18/2018   Past Surgical History:  Procedure Laterality Date  . PACEMAKER PLACEMENT       A IV Location/Drains/Wounds Patient Lines/Drains/Airways Status   Active Line/Drains/Airways    Name:   Placement date:   Placement time:   Site:   Days:   Peripheral IV 07/18/18 Left Forearm   07/18/18    1832    Forearm   less than 1          Intake/Output Last 24 hours No intake or output data in the 24 hours ending 07/18/18 2128  Labs/Imaging Results for  orders placed or performed during the hospital encounter of 07/18/18 (from the past 48 hour(s))  Basic metabolic panel     Status: Abnormal   Collection Time: 07/18/18  5:10 PM  Result Value Ref Range   Sodium 144 135 - 145 mmol/L   Potassium 4.4 3.5 - 5.1 mmol/L   Chloride 108 98 - 111 mmol/L   CO2 27 22 - 32 mmol/L   Glucose, Bld 112 (H) 70 - 99 mg/dL   BUN 21 8 - 23 mg/dL   Creatinine, Ser 0.09 (H) 0.44 - 1.00 mg/dL   Calcium 9.5 8.9 - 23.3 mg/dL   GFR calc non Af Amer 43 (L) >60 mL/min   GFR calc Af Amer 50 (L) >60 mL/min   Anion gap 9 5 - 15    Comment: Performed at [facility], [address], [place], [place] [address]  CBC     Status: Abnormal   Collection Time: 07/18/18  5:10 PM  Result Value Ref Range   WBC 13.1 (H) 4.0 - 10.5 K/uL   RBC 4.28 3.87 - 5.11 MIL/uL   Hemoglobin 13.6 12.0 - 15.0 g/dL   HCT 26.3 33.5 - 45.6 %   MCV 102.3 (H) 80.0 - 100.0 fL   MCH 31.8 26.0 - 34.0 pg   MCHC 31.1 30.0 - 36.0 g/dL   RDW 25.6 38.9 - 37.3 %   Platelets 347 150 - 400 K/uL   nRBC  0.0 0.0 - 0.2 %    Comment: Performed at [facility], [address] [place], [place] [address]  Urinalysis, Routine w reflex microscopic     Status: Abnormal   Collection Time: 07/18/18  5:10 PM  Result Value Ref Range   Color, Urine AMBER (A) YELLOW    Comment: BIOCHEMICALS MAY BE AFFECTED BY COLOR   APPearance CLOUDY (A) CLEAR   Specific Gravity, Urine 1.020 1.005 - 1.030   pH 5.0 5.0 - 8.0   Glucose, UA NEGATIVE NEGATIVE mg/dL   Hgb urine dipstick NEGATIVE NEGATIVE   Bilirubin Urine NEGATIVE NEGATIVE   Ketones, ur NEGATIVE NEGATIVE mg/dL   Protein, ur 30 (A) NEGATIVE mg/dL   Nitrite POSITIVE (A) NEGATIVE   Leukocytes,Ua LARGE (A) NEGATIVE   RBC / HPF 11-20 0 - 5 RBC/hpf   WBC, UA >50 (H) 0 - 5 WBC/hpf   Bacteria, UA MANY (A) NONE SEEN   Squamous Epithelial / LPF 0-5 0 - 5   WBC Clumps PRESENT    Mucus PRESENT    Hyaline Casts, UA PRESENT    Non  Squamous Epithelial 0-5 (A) NONE SEEN    Comment: Performed at [facility], [address], [place], [place] [address]   Dg Elbow Complete Right  Result Date: 07/18/2018 CLINICAL DATA:  History of dementia, now with right elbow pain and edema. EXAM: RIGHT ELBOW - COMPLETE 3+ VIEW COMPARISON:  Right humerus and forearm radiographs-earlier same day FINDINGS: There is an acute displaced avulsion fracture of the olecranon process with associated foreshortening and adjacent soft tissue swelling. No additional fractures identified given obliquity. No radiopaque foreign body. IMPRESSION: 1. Acute minimally displaced avulsion fracture of the olecranon process with associated foreshortening. 2. No additional fractures identified. Electronically Signed   By: Simonne Come M.D.   On: 07/18/2018 16:28   Dg Forearm Right  Result Date: 07/18/2018 CLINICAL DATA:  History of dementia, now with right upper extremity edema. EXAM: RIGHT FOREARM - 2 VIEW COMPARISON:  None. FINDINGS: There is an acute minimally displaced avulsion fracture involving the olecranon process with associated foreshortening and adjacent soft tissue swelling. No radiopaque foreign body. No additional fracture identified within the mid and distal aspects of the radius or ulna. Mild degenerative change involving the STT joints of the base of the thumb, incompletely evaluated. IMPRESSION: Acute, minimally displaced avulsion fracture of the olecranon process. Further evaluation with dedicated elbow radiographs could be performed as indicated. Electronically Signed   By: Simonne Come M.D.   On: 07/18/2018 15:41   Ct Head Wo Contrast  Result Date: 07/18/2018 CLINICAL DATA:  Trauma EXAM: CT HEAD WITHOUT CONTRAST CT CERVICAL SPINE WITHOUT CONTRAST TECHNIQUE: Multidetector CT imaging of the head and cervical spine was performed following the standard protocol without intravenous contrast. Multiplanar CT image reconstructions of the cervical  spine were also generated. COMPARISON:  None. FINDINGS: CT HEAD FINDINGS Brain: No evidence of acute infarction, hemorrhage, hydrocephalus, extra-axial collection or mass lesion/mass effect. Periventricular white matter disease and global volume loss. Vascular: No hyperdense vessel or unexpected calcification. Skull: Normal. Negative for fracture or focal lesion. Sinuses/Orbits: No acute finding. Other: None. CT CERVICAL SPINE FINDINGS Alignment: Normal. Skull base and vertebrae: There is a subtle, likely acute superior endplate deformity of C7 without significant fracture fragment pulsion. No primary bone lesion or focal pathologic process. Moderate to severe multilevel facet degenerative disease. Soft tissues and spinal canal: No prevertebral fluid or swelling. No visible canal hematoma. Disc levels:  Moderate multilevel disc and facet degenerative disease, worst at C3 through C5. Upper chest: Negative. Other: None. IMPRESSION: 1. No acute intracranial pathology. Small-vessel white matter disease and volume loss in keeping with advanced patient age. 2. Subtle, likely acute superior endplate deformity of C7 without significant fracture fragment pulsion. Correlate for referable pain. MRI may be used to further evaluate for marrow edema and fracture acuity if desired. Electronically Signed   By: Lauralyn Primes M.D.   On: 07/18/2018 16:24   Ct Cervical Spine Wo Contrast  Result Date: 07/18/2018 CLINICAL DATA:  Trauma EXAM: CT HEAD WITHOUT CONTRAST CT CERVICAL SPINE WITHOUT CONTRAST TECHNIQUE: Multidetector CT imaging of the head and cervical spine was performed following the standard protocol without intravenous contrast. Multiplanar CT image reconstructions of the cervical spine were also generated. COMPARISON:  None. FINDINGS: CT HEAD FINDINGS Brain: No evidence of acute infarction, hemorrhage, hydrocephalus, extra-axial collection or mass lesion/mass effect. Periventricular white matter disease and global volume  loss. Vascular: No hyperdense vessel or unexpected calcification. Skull: Normal. Negative for fracture or focal lesion. Sinuses/Orbits: No acute finding. Other: None. CT CERVICAL SPINE FINDINGS Alignment: Normal. Skull base and vertebrae: There is a subtle, likely acute superior endplate deformity of C7 without significant fracture fragment pulsion. No primary bone lesion or focal pathologic process. Moderate to severe multilevel facet degenerative disease. Soft tissues and spinal canal: No prevertebral fluid or swelling. No visible canal hematoma. Disc levels: Moderate multilevel disc and facet degenerative disease, worst at C3 through C5. Upper chest: Negative. Other: None. IMPRESSION: 1. No acute intracranial pathology. Small-vessel white matter disease and volume loss in keeping with advanced patient age. 2. Subtle, likely acute superior endplate deformity of C7 without significant fracture fragment pulsion. Correlate for referable pain. MRI may be used to further evaluate for marrow edema and fracture acuity if desired. Electronically Signed   By: Lauralyn Primes M.D.   On: 07/18/2018 16:24   Dg Chest Portable 1 View  Result Date: 07/18/2018 CLINICAL DATA:  Weakness EXAM: PORTABLE CHEST 1 VIEW COMPARISON:  None. FINDINGS: Two lead left subclavian pacemaker is noted with lead tips overlying the right atrium and right ventricle. Mild cardiomegaly. Minimally tortuous atherosclerotic thoracic aorta. Otherwise normal mediastinal contour. No pneumothorax. No pleural effusion. Lungs appear clear, with no acute consolidative airspace disease and no pulmonary edema. IMPRESSION: Mild cardiomegaly without pulmonary edema. No active pulmonary disease. Electronically Signed   By: Delbert Phenix M.D.   On: 07/18/2018 20:04   Dg Humerus Right  Result Date: 07/18/2018 CLINICAL DATA:  History of dementia, now with deformity involving the right arm. EXAM: RIGHT HUMERUS - 2+ VIEW COMPARISON:  Right forearm radiographs-earlier  same day FINDINGS: There is an acute minimally displaced avulsion fracture involving the olecranon process with associated foreshortening and adjacent soft tissue swelling. No radiopaque foreign body. No additional fracture identified within the proximal humerus. IMPRESSION: Acute, minimally displaced avulsion fracture of the olecranon process. Further evaluation with dedicated elbow radiographs could be performed as indicated. Electronically Signed   By: Simonne Come M.D.   On: 07/18/2018 15:40    Pending Labs Unresulted Labs (From admission, onward)    Start     Ordered   07/19/18 0500  Basic metabolic panel  Tomorrow morning,   R     07/18/18 2057   07/19/18 0500  CBC  Tomorrow morning,   R     07/18/18 2057   07/18/18 2059  Culture, blood (Routine X 2) w Reflex to ID Panel  BLOOD CULTURE X 2,   R    Comments:  Please obtain prior to antibiotic administration.    07/18/18 2058          Vitals/Pain Today's Vitals   07/18/18 1900 07/18/18 1905 07/18/18 1930 07/18/18 2100  BP: 127/86 127/86 132/61 126/82  Pulse: 80 80 83 77  Resp:  Temp:   97.8 F (36.6 C) 97.8 F (36.6 C)  TempSrc:   Axillary Axillary  SpO2: 93% 93% 96% 96%  Weight:      Height:        Isolation Precautions No active isolations  Medications Medications  protein supplement (ENSURE MAX) liquid (has no administration in time range)  cefTRIAXone (ROCEPHIN) 1 g in sodium chloride 0.9 % 100 mL IVPB (has no administration in time range)  atenolol (TENORMIN) tablet 25 mg (has no administration in time range)  doxylamine (Sleep) (UNISOM) tablet 25 mg (has no administration in time range)  PARoxetine (PAXIL) tablet 30 mg (has no administration in time range)  oxyCODONE-acetaminophen (PERCOCET/ROXICET) 5-325 MG per tablet 1 tablet (has no administration in time range)  methocarbamol (ROBAXIN) tablet 500 mg (has no administration in time range)  heparin injection 5,000 Units (has no administration in time  range)  acetaminophen (TYLENOL) tablet 650 mg (has no administration in time range)    Or  acetaminophen (TYLENOL) suppository 650 mg (has no administration in time range)  senna-docusate (Senokot-S) tablet 1 tablet (has no administration in time range)  ondansetron (ZOFRAN) tablet 4 mg (has no administration in time range)    Or  ondansetron (ZOFRAN) injection 4 mg (has no administration in time range)  hydrALAZINE (APRESOLINE) injection 5 mg (has no administration in time range)  0.9 %  sodium chloride infusion (has no administration in time range)  fentaNYL (SUBLIMAZE) injection 25 mcg (25 mcg Intramuscular Given 07/18/18 1628)  sodium chloride 0.9 % bolus 500 mL (0 mLs Intravenous Stopped 07/18/18 2115)  cefTRIAXone (ROCEPHIN) 1 g in sodium chloride 0.9 % 100 mL IVPB (0 g Intravenous Stopped 07/18/18 2115)    Mobility non-ambulatory High fall risk   Focused Assessments Muskuloskeletal   R Recommendations: See Admitting Provider Note  Report given to:   Additional Notes:

## 2018-07-18 NOTE — ED Provider Notes (Signed)
  Face-to-face evaluation   History: She presents for evaluation of right elbow pain, discovered this morning when home health nurse was trying to mobilize and give her a shower.  No distinct known injury.  Patient stayed home last night, and was in bed, went home health nurse found her today.  Patient has dementia and cannot give history.  Physical exam: Elderly, alert, uncooperative with exam.  Right arm tender elbow with bruising of the right dorsal forearm.  She appears to be neurovascular intact distally in the right hand however she holds the right hand clenched and will not open it.  She not in respiratory distress.  No other extremity injuries noted.  Medical screening examination/treatment/procedure(s) were conducted as a shared visit with non-physician practitioner(s) and myself.  I personally evaluated the patient during the encounter    Mancel Bale, MD 07/19/18 [phone]

## 2018-07-18 NOTE — ED Notes (Signed)
Ortho Tech at bedside with patient. 

## 2018-07-18 NOTE — ED Provider Notes (Signed)
[facility]-EMERGENCY DEPT Provider Note   CSN: [id number] Arrival date & time: 07/18/18  1441    History   Chief Complaint Chief Complaint  Patient presents with  . Right Arm Injury    HPI Jerica Swaggerty is a 85 y.o. female with a past medical history of dementia, difficulty hearing, from home who presents today for evaluation of right arm pain.  Patient is currently at baseline according to family per EMS.  Home health was out yesterday and gave patient a bath however did not note any bruising or pain.  Last night she reportedly had a decreased in appetite and has had about 4 Ensure drinks since then.  Family noticed that she is not fully using the right arm.  They did not see any evidence that she fell, unknown how she may have injured her arm however EMS reports that when she sits on the toilet the toilet paper roll is at about the level of her elbow so they suspect she may have hit it on that.     HPI  Past Medical History:  Diagnosis Date  . Dementia (HCC) 07/18/2018  . Hearing loss 07/18/2018  . Hypertension 07/18/2018    There are no active problems to display for this patient.   History reviewed. No pertinent surgical history.   OB History   No obstetric history on file.      Home Medications    Prior to Admission medications   Not on File    Family History History reviewed. No pertinent family history.  Social History Social History   Tobacco Use  . Smoking status: Unknown If Ever Smoked  Substance Use Topics  . Alcohol use: Not Currently  . Drug use: Never     Allergies   Patient has no known allergies.   Review of Systems Review of Systems  Unable to perform ROS: Patient nonverbal     Physical Exam Updated Vital Signs BP 109/71 (BP Location: Left Arm)   Pulse 84   Temp 99.1 F (37.3 C) (Axillary)   Resp 18   Ht 5\' 7"  (1.702 m)   Wt 81.6 kg   SpO2 94%   BMI 28.19 kg/m   Physical Exam Vitals signs and  nursing note reviewed.  Constitutional:      General: She is not in acute distress.    Appearance: She is well-developed. She is not diaphoretic.  HENT:     Head: Normocephalic and atraumatic.     Right Ear: External ear normal.     Left Ear: External ear normal.     Nose: Nose normal.     Mouth/Throat:     Mouth: Mucous membranes are moist.  Eyes:     General: No scleral icterus.       Right eye: No discharge.        Left eye: No discharge.     Conjunctiva/sclera: Conjunctivae normal.  Neck:     Musculoskeletal: Normal range of motion.  Cardiovascular:     Rate and Rhythm: Normal rate and regular rhythm.     Pulses: Normal pulses.     Heart sounds: Normal heart sounds.     Comments: 2+ right radial pulse Pulmonary:     Effort: Pulmonary effort is normal. No respiratory distress.     Breath sounds: Normal breath sounds. No stridor.  Abdominal:     General: There is no distension.  Musculoskeletal:        General: No deformity.  Comments: Diffuse TTP and edema of right elbow.  Does not react consistent with pain with palpation of distal forearm, or proximal humerus.   Skin:    General: Skin is warm and dry.  Neurological:     Mental Status: She is alert.     Motor: No abnormal muscle tone.     Comments: At baseline per family per EMS.  Does not follow commands  Psychiatric:        Behavior: Behavior normal.      ED Treatments / Results  Labs (all labs ordered are listed, but only abnormal results are displayed) Labs Reviewed - No data to display  EKG None  Radiology Dg Forearm Right  Result Date: 07/18/2018 CLINICAL DATA:  History of dementia, now with right upper extremity edema. EXAM: RIGHT FOREARM - 2 VIEW COMPARISON:  None. FINDINGS: There is an acute minimally displaced avulsion fracture involving the olecranon process with associated foreshortening and adjacent soft tissue swelling. No radiopaque foreign body. No additional fracture identified within the  mid and distal aspects of the radius or ulna. Mild degenerative change involving the STT joints of the base of the thumb, incompletely evaluated. IMPRESSION: Acute, minimally displaced avulsion fracture of the olecranon process. Further evaluation with dedicated elbow radiographs could be performed as indicated. Electronically Signed   By: Simonne Come M.D.   On: 07/18/2018 15:41   Dg Humerus Right  Result Date: 07/18/2018 CLINICAL DATA:  History of dementia, now with deformity involving the right arm. EXAM: RIGHT HUMERUS - 2+ VIEW COMPARISON:  Right forearm radiographs-earlier same day FINDINGS: There is an acute minimally displaced avulsion fracture involving the olecranon process with associated foreshortening and adjacent soft tissue swelling. No radiopaque foreign body. No additional fracture identified within the proximal humerus. IMPRESSION: Acute, minimally displaced avulsion fracture of the olecranon process. Further evaluation with dedicated elbow radiographs could be performed as indicated. Electronically Signed   By: Simonne Come M.D.   On: 07/18/2018 15:40    Procedures Procedures (including critical care time)  Medications Ordered in ED Medications  fentaNYL (SUBLIMAZE) injection 25 mcg (has no administration in time range)     Initial Impression / Assessment and Plan / ED Course  I have reviewed the triage vital signs and the nursing notes.  Pertinent labs & imaging results that were available during my care of the patient were reviewed by me and considered in my medical decision making (see chart for details).       Patient presents today from home for evaluation of right elbow pain and swelling.  At the time of my evaluation no family was present, therefore history obtained from EMS.  Patient reportedly has had decreased use of right arm and decreased appetite.  Patient is nonverbal and unable to contribute to history.  Physical exam concerning for pain on elbow of right arm,  however patient is not able to localize pain well therefore humerus and forearm films were obtained.  X-rays of the forearm shows avulsion fracture of the olecranon process.  Orders for elbow x-rays were obtained.  As unsure what caused fracture and she may have fallen CT head and neck were ordered.  At shift change care was transferred to [facility] who will follow pending studies, re-evaulate and determine disposition.      Final Clinical Impressions(s) / ED Diagnoses   Final diagnoses:  Closed fracture of right olecranon process, initial encounter    ED Discharge Orders    None  Norman Clay 07/18/18 1549    Azalia Bilis, MD 07/18/18 [phone]

## 2018-07-18 NOTE — ED Notes (Signed)
Patient transported to X-ray 

## 2018-07-18 NOTE — ED Notes (Signed)
Bed: WA21 Expected date:  Expected time:  Means of arrival:  Comments: EMS  

## 2018-07-19 DIAGNOSIS — R630 Anorexia: Secondary | ICD-10-CM | POA: Diagnosis not present

## 2018-07-19 DIAGNOSIS — Z66 Do not resuscitate: Secondary | ICD-10-CM | POA: Diagnosis not present

## 2018-07-19 DIAGNOSIS — R410 Disorientation, unspecified: Secondary | ICD-10-CM

## 2018-07-19 DIAGNOSIS — S52021A Displaced fracture of olecranon process without intraarticular extension of right ulna, initial encounter for closed fracture: Secondary | ICD-10-CM

## 2018-07-19 DIAGNOSIS — Z9181 History of falling: Secondary | ICD-10-CM | POA: Diagnosis not present

## 2018-07-19 DIAGNOSIS — N3001 Acute cystitis with hematuria: Secondary | ICD-10-CM | POA: Diagnosis not present

## 2018-07-19 DIAGNOSIS — S52021D Displaced fracture of olecranon process without intraarticular extension of right ulna, subsequent encounter for closed fracture with routine healing: Secondary | ICD-10-CM | POA: Diagnosis not present

## 2018-07-19 LAB — BASIC METABOLIC PANEL
ANION GAP: 8 (ref 5–15)
BUN: 22 mg/dL (ref 8–23)
CO2: 25 mmol/L (ref 22–32)
Calcium: 8.6 mg/dL — ABNORMAL LOW (ref 8.9–10.3)
Chloride: 111 mmol/L (ref 98–111)
Creatinine, Ser: 0.88 mg/dL (ref 0.44–1.00)
GFR calc Af Amer: >60 mL/min (ref >60)
GFR calc non Af Amer: 58 mL/min — ABNORMAL LOW (ref >60)
GLUCOSE: 105 mg/dL — AB (ref 70–99)
Potassium: 3.8 mmol/L (ref 3.5–5.1)
Sodium: 144 mmol/L (ref 135–145)

## 2018-07-19 LAB — CBC
HCT: 38.6 % (ref 36.0–46.0)
Hemoglobin: 11.8 g/dL — ABNORMAL LOW (ref 12.0–15.0)
MCH: 32.2 pg (ref 26.0–34.0)
MCHC: 30.6 g/dL (ref 30.0–36.0)
MCV: 105.2 fL — ABNORMAL HIGH (ref 80.0–100.0)
Platelets: 288 10*3/uL (ref 150–400)
RBC: 3.67 MIL/uL — ABNORMAL LOW (ref 3.87–5.11)
RDW: 12.6 % (ref 11.5–15.5)
WBC: 9.9 10*3/uL (ref 4.0–10.5)
nRBC: 0 % (ref 0.0–0.2)

## 2018-07-19 MED ORDER — MORPHINE SULFATE (PF) 2 MG/ML IV SOLN
0.5000 mg | INTRAVENOUS | Status: DC | PRN
  Administered 2018-07-19 – 2018-07-20 (×2): 0.5 mg via INTRAVENOUS
  Filled 2018-07-19 (×2): qty 1

## 2018-07-19 NOTE — Progress Notes (Signed)
PROGRESS NOTE                                                                                                                                                                                                             Patient Demographics:    Casey Lam, is a 85 y.o. female, DOB - 11-17-1925, ZOX:[id number]  Admit date - 07/18/2018   Admitting Physician Lorretta HarpXilin Niu, MD  Outpatient Primary MD for the patient is Casey InchBadger, Casey C, MD  LOS - 0  Outpatient Specialists:   Chief Complaint  Patient presents with  . Right Arm Injury       Brief Narrative   85 year old female with CKD stage II,?  Advanced dementia, depression hypertension, lives at home with home health presented with right elbow pain.  She was also found to have poor appetite and increasingly disoriented.  No history of trauma or fall.  In the ED she had elevated WBC of 13 K with UA positive for UTI, mild AKI and x-ray of the right elbow showing acute minimally displaced evulsion fracture of her right olecranon process. Orthopedic surgeon consulted and placed on observation.   Subjective:   Patient poorly communicative and unable to provide any history.   Assessment  & Plan :    Principal Problem: Displaced fracture of olecranon process of right ulna Unclear cause of trauma.  Cannot rule out possible fall.  CT of the head and cervical spine negative for any acute injury.  Splint applied in the ED.  Seen by orthopedic surgery Dr. Magnus IvanBlackman who recommended given her age and severe dementia with the risk of noncompliance and hardware failure from soft bone. Recommended treating with splinting and let the elbow to use stiffen up which can provide reasonable function of the right upper arm. Recommend splinting of his elbow for up to 4-6 weeks and follow-up in the office. Pain control with Percocet and Robaxin for spasms. Seen by PT and OT and recommend  SNF.  Active Problems: Dementia, moderate to severe with delirium/acute metabolic encephalopathy Likely further triggered by underlying UTI.  Monitor closely.  Urinary tract infection Placed on empiric Rocephin.  Urine culture unfortunately not sent on admission.  Follow blood culture results.     Acute renal failure superimposed on stage 2 chronic kidney disease (HCC) Creatinine increased to 1.12 from baseline of 0.87.  Resolved in a.m. with IV fluids.  Avoid nephrotoxins.   Essential hypertension Continue atenolol.  Dysphagia with mild to moderate aspiration risk. Seen by SLP and recommends dysphagia level 3 diet with thin liquid.  Admission status: Observation   Code Status : DNR  Family Communication  : None at bedside  Disposition Plan  : Skilled nursing facility  Barriers For Discharge : Active symptoms Consults  : [facility] (Dr. [name])  Procedures  : CT head and cervical spine, splinting of right elbow in the ED  DVT Prophylaxis  :  Lovenox   Lab Results  Component Value Date   PLT 288 07/19/2018    Antibiotics  :  Anti-infectives (From admission, onward)   Start     Dose/Rate Route Frequency Ordered Stop   07/19/18 2000  cefTRIAXone (ROCEPHIN) 1 g in sodium chloride 0.9 % 100 mL IVPB     1 g 200 mL/hr over 30 Minutes Intravenous Every 24 hours 07/18/18 2053     07/18/18 2000  cefTRIAXone (ROCEPHIN) 1 g in sodium chloride 0.9 % 100 mL IVPB     1 g 200 mL/hr over 30 Minutes Intravenous  Once 07/18/18 1950 07/18/18 2115        Objective:   Vitals:   07/18/18 2215 07/18/18 2215 07/19/18 0531 07/19/18 1253  BP: 125/72  122/89 127/78  Pulse: 78 70 77 68  Resp: 18  15 15   Temp: 98.4 F (36.9 Lam)  (!) 96.9 F (36.1 Lam) 97.8 F (36.6 Lam)  TempSrc: Oral  Axillary Oral  SpO2: (!) 88% 96% 95% 98%  Weight:      Height:        Wt Readings from Last 3 Encounters:  07/18/18 81.6 kg     Intake/Output Summary (Last 24 hours) at 07/19/2018  1517 Last data filed at 07/19/2018 1242 Gross per 24 hour  Intake 292.1 ml  Output 250 ml  Net 42.1 ml     Physical Exam  Gen: not in distress, poorly communicative HEENT: no pallor, moist mucosa, supple neck Chest: clear b/l, no added sounds CVS: N S1&S2, no murmurs,  GI: soft, NT, ND, BS+ Musculoskeletal: warm, right elbow splint, no leg edema CNS: AAOX0, nonfocal    Data Review:    CBC Recent Labs  Lab 07/18/18 1710 07/19/18 0348  WBC 13.1* 9.9  HGB 13.6 11.8*  HCT 43.8 38.6  PLT 347 288  MCV 102.3* 105.2*  MCH 31.8 32.2  MCHC 31.1 30.6  RDW 12.9 12.6    Chemistries  Recent Labs  Lab 07/18/18 1710 07/19/18 0348  NA 144 144  K 4.4 3.8  CL 108 111  CO2 27 25  GLUCOSE 112* 105*  BUN 21 22  CREATININE 1.12* 0.88  CALCIUM 9.5 8.6*   ------------------------------------------------------------------------------------------------------------------ No results for input(s): CHOL, HDL, LDLCALC, TRIG, CHOLHDL, LDLDIRECT in the last 72 hours.  No results found for: HGBA1C ------------------------------------------------------------------------------------------------------------------ No results for input(s): TSH, T4TOTAL, T3FREE, THYROIDAB in the last 72 hours.  Invalid input(s): FREET3 ------------------------------------------------------------------------------------------------------------------ No results for input(s): VITAMINB12, FOLATE, FERRITIN, TIBC, IRON, RETICCTPCT in the last 72 hours.  Coagulation profile No results for input(s): INR, PROTIME in the last 168 hours.  No results for input(s): DDIMER in the last 72 hours.  Cardiac Enzymes No results for input(s): CKMB, TROPONINI, MYOGLOBIN in the last 168 hours.  Invalid input(s): CK ------------------------------------------------------------------------------------------------------------------ No results found for: BNP  Inpatient Medications  Scheduled Meds: . atenolol  25 mg Oral Daily   . doxylamine (  Sleep)  1 tablet Oral QHS  . heparin  5,000 Units Subcutaneous Q8H  . PARoxetine  30 mg Oral Daily  . Ensure Max Protein  11 oz Oral Daily   Continuous Infusions: . sodium chloride 75 mL/hr at 07/19/18 1139  . cefTRIAXone (ROCEPHIN)  IV     PRN Meds:.acetaminophen **OR** acetaminophen, hydrALAZINE, methocarbamol, morphine injection, ondansetron **OR** ondansetron (ZOFRAN) IV, oxyCODONE-acetaminophen, senna-docusate  Micro Results No results found for this or any previous visit (from the past 240 hour(s)).  Radiology Reports Dg Elbow Complete Right  Result Date: 07/18/2018 CLINICAL DATA:  History of dementia, now with right elbow pain and edema. EXAM: RIGHT ELBOW - COMPLETE 3+ VIEW COMPARISON:  Right humerus and forearm radiographs-earlier same day FINDINGS: There is an acute displaced avulsion fracture of the olecranon process with associated foreshortening and adjacent soft tissue swelling. No additional fractures identified given obliquity. No radiopaque foreign body. IMPRESSION: 1. Acute minimally displaced avulsion fracture of the olecranon process with associated foreshortening. 2. No additional fractures identified. Electronically Signed   By: Simonne Come M.D.   On: 07/18/2018 16:28   Dg Forearm Right  Result Date: 07/18/2018 CLINICAL DATA:  History of dementia, now with right upper extremity edema. EXAM: RIGHT FOREARM - 2 VIEW COMPARISON:  None. FINDINGS: There is an acute minimally displaced avulsion fracture involving the olecranon process with associated foreshortening and adjacent soft tissue swelling. No radiopaque foreign body. No additional fracture identified within the mid and distal aspects of the radius or ulna. Mild degenerative change involving the STT joints of the base of the thumb, incompletely evaluated. IMPRESSION: Acute, minimally displaced avulsion fracture of the olecranon process. Further evaluation with dedicated elbow radiographs could be performed as  indicated. Electronically Signed   By: Simonne Come M.D.   On: 07/18/2018 15:41   Ct Head Wo Contrast  Result Date: 07/18/2018 CLINICAL DATA:  Trauma EXAM: CT HEAD WITHOUT CONTRAST CT CERVICAL SPINE WITHOUT CONTRAST TECHNIQUE: Multidetector CT imaging of the head and cervical spine was performed following the standard protocol without intravenous contrast. Multiplanar CT image reconstructions of the cervical spine were also generated. COMPARISON:  None. FINDINGS: CT HEAD FINDINGS Brain: No evidence of acute infarction, hemorrhage, hydrocephalus, extra-axial collection or mass lesion/mass effect. Periventricular white matter disease and global volume loss. Vascular: No hyperdense vessel or unexpected calcification. Skull: Normal. Negative for fracture or focal lesion. Sinuses/Orbits: No acute finding. Other: None. CT CERVICAL SPINE FINDINGS Alignment: Normal. Skull base and vertebrae: There is a subtle, likely acute superior endplate deformity of C7 without significant fracture fragment pulsion. No primary bone lesion or focal pathologic process. Moderate to severe multilevel facet degenerative disease. Soft tissues and spinal canal: No prevertebral fluid or swelling. No visible canal hematoma. Disc levels: Moderate multilevel disc and facet degenerative disease, worst at C3 through C5. Upper chest: Negative. Other: None. IMPRESSION: 1. No acute intracranial pathology. Small-vessel white matter disease and volume loss in keeping with advanced patient age. 2. Subtle, likely acute superior endplate deformity of C7 without significant fracture fragment pulsion. Correlate for referable pain. MRI may be used to further evaluate for marrow edema and fracture acuity if desired. Electronically Signed   By: Lauralyn Primes M.D.   On: 07/18/2018 16:24   Ct Cervical Spine Wo Contrast  Result Date: 07/18/2018 CLINICAL DATA:  Trauma EXAM: CT HEAD WITHOUT CONTRAST CT CERVICAL SPINE WITHOUT CONTRAST TECHNIQUE: Multidetector CT  imaging of the head and cervical spine was performed following the standard protocol without intravenous contrast. Multiplanar  CT image reconstructions of the cervical spine were also generated. COMPARISON:  None. FINDINGS: CT HEAD FINDINGS Brain: No evidence of acute infarction, hemorrhage, hydrocephalus, extra-axial collection or mass lesion/mass effect. Periventricular white matter disease and global volume loss. Vascular: No hyperdense vessel or unexpected calcification. Skull: Normal. Negative for fracture or focal lesion. Sinuses/Orbits: No acute finding. Other: None. CT CERVICAL SPINE FINDINGS Alignment: Normal. Skull base and vertebrae: There is a subtle, likely acute superior endplate deformity of C7 without significant fracture fragment pulsion. No primary bone lesion or focal pathologic process. Moderate to severe multilevel facet degenerative disease. Soft tissues and spinal canal: No prevertebral fluid or swelling. No visible canal hematoma. Disc levels: Moderate multilevel disc and facet degenerative disease, worst at C3 through C5. Upper chest: Negative. Other: None. IMPRESSION: 1. No acute intracranial pathology. Small-vessel white matter disease and volume loss in keeping with advanced patient age. 2. Subtle, likely acute superior endplate deformity of C7 without significant fracture fragment pulsion. Correlate for referable pain. MRI may be used to further evaluate for marrow edema and fracture acuity if desired. Electronically Signed   By: Lauralyn Primes M.D.   On: 07/18/2018 16:24   Dg Chest Portable 1 View  Result Date: 07/18/2018 CLINICAL DATA:  Weakness EXAM: PORTABLE CHEST 1 VIEW COMPARISON:  None. FINDINGS: Two lead left subclavian pacemaker is noted with lead tips overlying the right atrium and right ventricle. Mild cardiomegaly. Minimally tortuous atherosclerotic thoracic aorta. Otherwise normal mediastinal contour. No pneumothorax. No pleural effusion. Lungs appear clear, with no acute  consolidative airspace disease and no pulmonary edema. IMPRESSION: Mild cardiomegaly without pulmonary edema. No active pulmonary disease. Electronically Signed   By: Delbert Phenix M.D.   On: 07/18/2018 20:04   Dg Humerus Right  Result Date: 07/18/2018 CLINICAL DATA:  History of dementia, now with deformity involving the right arm. EXAM: RIGHT HUMERUS - 2+ VIEW COMPARISON:  Right forearm radiographs-earlier same day FINDINGS: There is an acute minimally displaced avulsion fracture involving the olecranon process with associated foreshortening and adjacent soft tissue swelling. No radiopaque foreign body. No additional fracture identified within the proximal humerus. IMPRESSION: Acute, minimally displaced avulsion fracture of the olecranon process. Further evaluation with dedicated elbow radiographs could be performed as indicated. Electronically Signed   By: Simonne Come M.D.   On: 07/18/2018 15:40    Time Spent in minutes  25   Makenzee Choudhry M.D on 07/19/2018 at 3:17 PM  Between 7am to 7pm - Pager - [phone]  After 7pm go to [email or web address] - password [email or web address]  [facility] -  Office  [phone]

## 2018-07-19 NOTE — Consult Note (Signed)
Reason for Consult: Right elbow olecranon fracture Consulting Provider:  [facility] EDP  The patient is a 85 year old female with multiple medical problems including dementia who presented to the emergency room yesterday with right elbow pain and swelling.  She likely had some type of mechanical fall.  She is found to have a displaced olecranon fracture and orthopedics was consulted.  Per my request I had her placed in a well-padded splint with her elbow flexed to 90 degrees.  She was admitted to the hospital due to UTI.  According the notes, at her baseline she does have dementia but recognizes family members and is mobile.  She has had a decrease in appetite and slight worsening of her dementia.  On exam today at the bedside she is alert but not oriented.  She would not follow any commands.  The splint is well fitting.  I did review her x-rays in her medical record as well as her medications.  The x-rays of the right elbow do show a displaced right elbow olecranon fracture.  I would still tend to treat these nonoperatively even with displacement.  This is mainly due to her age combined with her dementia and frail soft tissue in this area.  Fixation of olecranon fractures is right with complications in this age group with hardware failure due to noncompliance from dementia as well as hardware failure from soft bone.  I personally have seen several that have broken down there is soft tissue around hardware of the elbow leading to other more severe issues.  Usually if you can treat these with splinting and let the elbow stiffen up, patient still can end up with reasonable function of the right upper extremity.  My plan will be to splint her elbow for up to 4 to 6 weeks.  I can see her in close follow-up in the office.  Do call with questions and concerns.

## 2018-07-19 NOTE — Progress Notes (Signed)
Spoke with son in Social worker, [place], by phone. He is requesting Social Worker call his wife regarding placement of patient into a SNF. He voiced that patient's care is "more than we can handle, even with the help we have coming into the house with the fracture". Horton Chin that meetings with Social Work usually occur around [employer] and I will ask Child psychotherapist to call his wife, as requested, after we meet. Lina Sar, RN

## 2018-07-19 NOTE — Discharge Instructions (Signed)
Keep right arm splint clean and dry.   Can come out of the sling at any time. Can use her right hand as comfort allows.

## 2018-07-19 NOTE — Evaluation (Signed)
Physical Therapy Evaluation Patient Details Name: Casey Lam MRN: [id number] DOB: 1926/02/02 Today's Date: 07/19/2018   History of Present Illness  85 y.o. female with medical history significant of hypertension, depression, dementia, CKD stage II, who presents with right elbow pain. Dx of UTI, R olecranon fx. Conservative tx.   Clinical Impression  Pt admitted with above diagnosis. Pt currently with functional limitations due to the deficits listed below (see PT Problem List). Mod assist for supine to sit. Min/mod assist to transfer bed to recliner with hand held assist of 1. Pt oriented x 0, no family present to provide prior functional level, however H&P states pt was ambulatory at baseline.  Pt will benefit from skilled PT to increase their independence and safety with mobility to allow discharge to the venue listed below.       Follow Up Recommendations SNF;Supervision/Assistance - 24 hour;Supervision for mobility/OOB    Equipment Recommendations  Wheelchair (measurements PT);Wheelchair cushion (measurements PT)    Recommendations for Other Services       Precautions / Restrictions Precautions Precautions: Fall Required Braces or Orthoses: Sling Restrictions Other Position/Activity Restrictions: assume NWB RUE      Mobility  Bed Mobility Overal bed mobility: Needs Assistance Bed Mobility: Supine to Sit     Supine to sit: Mod assist     General bed mobility comments: mod A to advance LEs to edge of bed and to raise trunk  Transfers Overall transfer level: Needs assistance Equipment used: 1 person hand held assist Transfers: Sit to/from [employer] Sit to Stand: Mod assist Stand pivot transfers: Min assist       General transfer comment: mod A to rise, min A for balance to take a few pivotal steps to recliner  Ambulation/Gait                Stairs            Wheelchair Mobility    Modified Rankin (Stroke Patients Only)        Balance Overall balance assessment: Needs assistance Sitting-balance support: Single extremity supported;Feet supported Sitting balance-Leahy Scale: Good       Standing balance-Leahy Scale: Fair                               Pertinent Vitals/Pain Pain Assessment: Faces Faces Pain Scale: Hurts a little bit Pain Location: right arm/elbow with movement Pain Descriptors / Indicators: Grimacing;Guarding Pain Intervention(s): Limited activity within patient's tolerance;Monitored during session;Repositioned    Home Living Family/patient expects to be discharged to:: Skilled nursing facility                      Prior Function Level of Independence: Needs assistance         Comments: per H&P pt was ambulatory at baseline, not known if she used an AD; no family present to provide further info     Hand Dominance        Extremity/Trunk Assessment   Upper Extremity Assessment Upper Extremity Assessment: Defer to OT evaluation    Lower Extremity Assessment Lower Extremity Assessment: Overall WFL for tasks assessed    Cervical / Trunk Assessment Cervical / Trunk Assessment: Normal  Communication   Communication: No difficulties  Cognition Arousal/Alertness: Awake/alert Behavior During Therapy: WFL for tasks assessed/performed Overall Cognitive Status: No family/caregiver present to determine baseline cognitive functioning  General Comments: pt not able to state her name nor birthdate, doesn't follow commands, pleasant during PT eval; no family present      General Comments      Exercises     Assessment/Plan    PT Assessment Patient needs continued PT services  PT Problem List Decreased activity tolerance;Decreased mobility;Decreased cognition       PT Treatment Interventions Gait training;Therapeutic activities;Therapeutic exercise;Patient/family education    PT Goals (Current goals can be  found in the Care Plan section)  Acute Rehab PT Goals PT Goal Formulation: Patient unable to participate in goal setting Time For Goal Achievement: 08/02/18 Potential to Achieve Goals: Fair    Frequency Min 2X/week   Barriers to discharge        Co-evaluation               AM-PAC PT "6 Clicks" Mobility  Outcome Measure Help needed turning from your back to your side while in a flat bed without using bedrails?: A Lot Help needed moving from lying on your back to sitting on the side of a flat bed without using bedrails?: A Lot Help needed moving to and from a bed to a chair (including a wheelchair)?: A Little Help needed standing up from a chair using your arms (e.g., wheelchair or bedside chair)?: A Lot Help needed to walk in hospital room?: A Lot Help needed climbing 3-5 steps with a railing? : Total 6 Click Score: 12    End of Session Equipment Utilized During Treatment: Gait belt(R sling) Activity Tolerance: Patient tolerated treatment well Patient left: in chair;with call bell/phone within reach;with chair alarm set Nurse Communication: Mobility status PT Visit Diagnosis: Difficulty in walking, not elsewhere classified (R26.2)    Time: 8850-2774 PT Time Calculation (min) (ACUTE ONLY): 17 min   Charges:   PT Evaluation $PT Eval Low Complexity: 1 Low          Tamala Ser PT 07/19/2018  Acute Rehabilitation Services Pager [phone] Office [phone]

## 2018-07-19 NOTE — Evaluation (Signed)
Occupational Therapy Evaluation Patient Details Name: Casey Lam MRN: [id number] DOB: March 08, 1926 Today's Date: 07/19/2018    History of Present Illness 85 y.o. female with medical history significant of hypertension, depression, dementia, CKD stage II, who presents with right elbow pain. Dx of UTI, R olecranon fx. Conservative tx.    Clinical Impression   Pt admitted with R elbow pain. Pt currently with functional limitations due to the deficits listed below (see OT Problem List).  Pt will benefit from skilled OT to increase their safety and independence with ADL and functional mobility for ADL to facilitate discharge to venue listed below.   OT readjuisted sling and pt made a face.  Pt was able to move R fingers for OT but refused to move shoulder.       Follow Up Recommendations  SNF    Equipment Recommendations  None recommended by OT    Recommendations for Other Services       Precautions / Restrictions Precautions Precautions: Fall Required Braces or Orthoses: Sling Restrictions Other Position/Activity Restrictions: assume NWB RUE      Mobility Bed Mobility Overal bed mobility: Needs Assistance Bed Mobility: Supine to Sit     Supine to sit: Mod assist     General bed mobility comments: pt refused  Transfers Overall transfer level: Needs assistance Equipment used: 1 person hand held assist Transfers: Sit to/from Stand;Stand Pivot Transfers Sit to Stand: Mod assist Stand pivot transfers: Min assist       General transfer comment: did not perform    Balance Overall balance assessment: Needs assistance Sitting-balance support: Single extremity supported;Feet supported Sitting balance-Leahy Scale: Good       Standing balance-Leahy Scale: Fair                             ADL either performed or assessed with clinical judgement   ADL Overall ADL's : Needs assistance/impaired     Grooming: Wash/dry face;Minimal assistance;Bed level                                                    Pertinent Vitals/Pain Pain Assessment: Faces Pain Score: 6  Faces Pain Scale: Hurts a little bit Pain Location: right arm/elbow with adjuisting sling- Pain Descriptors / Indicators: Grimacing;Guarding Pain Intervention(s): Limited activity within patient's tolerance;Monitored during session;Repositioned     Hand Dominance     Extremity/Trunk Assessment Upper Extremity Assessment Upper Extremity Assessment: LUE deficits/detail LUE Deficits / Details: Pt with L elbow fracture.  Pt able to move fingers as OT ask.     Lower Extremity Assessment Lower Extremity Assessment: Overall WFL for tasks assessed   Cervical / Trunk Assessment Cervical / Trunk Assessment: Normal   Communication Communication Communication: No difficulties   Cognition Arousal/Alertness: Awake/alert Behavior During Therapy: Restless Overall Cognitive Status: No family/caregiver present to determine baseline cognitive functioning                                 General Comments: pt not able to state her name nor birthdate, doesn't follow commands, pleasant during PT eval; no family present   General Comments               Home Living Family/patient expects to be discharged  to:: Skilled nursing facility                                        Prior Functioning/Environment Level of Independence: Needs assistance        Comments: per H&P pt was ambulatory at baseline, not known if she used an AD; no family present to provide further info        OT Problem List: Decreased strength;Decreased activity tolerance;Decreased safety awareness;Decreased knowledge of precautions;Decreased range of motion      OT Treatment/Interventions: Self-care/ADL training;DME and/or AE instruction;Patient/family education;Therapeutic exercise    OT Goals(Current goals can be found in the care plan section) Acute Rehab OT  Goals Patient Stated Goal: did not state OT Goal Formulation: With patient Time For Goal Achievement: 08/02/18 Potential to Achieve Goals: Good  OT Frequency: Min 2X/week   Barriers to D/C: Decreased caregiver support             AM-PAC OT "6 Clicks" Daily Activity     Outcome Measure Help from another person eating meals?: A Little Help from another person taking care of personal grooming?: A Little Help from another person toileting, which includes using toliet, bedpan, or urinal?: Total Help from another person bathing (including washing, rinsing, drying)?: Total Help from another person to put on and taking off regular upper body clothing?: Total Help from another person to put on and taking off regular lower body clothing?: Total 6 Click Score: 10   End of Session Nurse Communication: Other (comment)(positioning of sling)  Activity Tolerance: Treatment limited secondary to agitation Patient left: in bed  OT Visit Diagnosis: History of falling (Z91.81);Muscle weakness (generalized) (M62.81)                Time: 2111-7356 OT Time Calculation (min): 10 min Charges:  OT General Charges $OT Visit: 1 Visit OT Evaluation $OT Eval Moderate Complexity: 1 Mod  Lise Auer, OT Acute Rehabilitation Services [phone] Office- [phone]     Katlyne Nishida, Karin Golden D 07/19/2018, 2:10 PM

## 2018-07-19 NOTE — Evaluation (Signed)
Clinical/Bedside Swallow Evaluation Patient Details  Name: Casey Lam MRN: [id number] Date of Birth: Jun 28, 1925  Today's Date: 07/19/2018 Time: SLP Start Time (ACUTE ONLY): 1245 SLP Stop Time (ACUTE ONLY): 1310 SLP Time Calculation (min) (ACUTE ONLY): 25 min  Past Medical History:  Past Medical History:  Diagnosis Date  . Dementia (HCC) 07/18/2018  . Hearing loss 07/18/2018  . Hypertension 07/18/2018   Past Surgical History:  Past Surgical History:  Procedure Laterality Date  . PACEMAKER PLACEMENT     HPI:  85 year old female admitted 07/18/2018 with right UE/elbow pain, likely due to a fall. PMH: dementia, HTN, depression, CKD2   Assessment / Plan / Recommendation Clinical Impression  Pt seen at bedside to assess pt swallow function and safety for least restrictive diet. CN exam is unremarkable, with oral motor strength and function adequate. Pt is notably confused, but was cooperative with this SLP. Pt oral cavity was noted to be extremely dry, likely due to NPO status. Pt accepted and tolerated trials of ice chips, thin liquid, puree, and solid (graham cracker). No obvious oral issues or overt s/s aspiration observed following any consistency. At this time, will recommend mechanical soft solids with chopped meats, thin liquids. 1:1 assistance with meals is also recommended, due to significant confusion. Safe swallow precautions posted at [facility]. SLP will follow up to assess diet tolerance and provide education. RN and MD informed of results and recommendations.    SLP Visit Diagnosis: Dysphagia, unspecified (R13.10)    Aspiration Risk  Mild aspiration risk;Moderate aspiration risk    Diet Recommendation Dysphagia 3 (Mech soft);Thin liquid   Liquid Administration via: Straw;Cup Medication Administration: (as pt tolerates) Supervision: Patient able to self feed;Staff to assist with self feeding;Full supervision/cueing for compensatory strategies Compensations: Minimize  environmental distractions;Slow rate;Small sips/bites Postural Changes: Seated upright at 90 degrees;Remain upright for at least 30 minutes after po intake    Other  Recommendations Oral Care Recommendations: Oral care BID   Follow up Recommendations 24 hour supervision/assistance      Frequency and Duration min 1 x/week  1 week;2 weeks       Prognosis Prognosis for Safe Diet Advancement: Fair Barriers to Reach Goals: Cognitive deficits      Swallow Study   General Date of Onset: 07/18/18 HPI: 85 year old female admitted 07/18/2018 with right UE/elbow pain, likely due to a fall. PMH: dementia, HTN, depression, CKD2 Type of Study: Bedside Swallow Evaluation Previous Swallow Assessment: none found Diet Prior to this Study: NPO Temperature Spikes Noted: No Respiratory Status: Nasal cannula History of Recent Intubation: No Behavior/Cognition: Alert;Confused;Requires cueing Oral Cavity Assessment: Dry Oral Cavity - Dentition: Adequate natural dentition Vision: Functional for self-feeding Self-Feeding Abilities: Able to feed self Patient Positioning: Upright in bed Baseline Vocal Quality: Normal Volitional Cough: Cognitively unable to elicit Volitional Swallow: Unable to elicit    Oral/Motor/Sensory Function Overall Oral Motor/Sensory Function: Within functional limits   Ice Chips Ice chips: Within functional limits Presentation: Spoon   Thin Liquid Thin Liquid: Within functional limits Presentation: Straw    Nectar Thick Nectar Thick Liquid: Not tested   Honey Thick Honey Thick Liquid: Not tested   Puree Puree: Within functional limits Presentation: Spoon   Solid     Solid: Within functional limits Presentation: Self Fed     Casey Lam B. Murvin Natal, [facility], CCC-SLP Speech Language Pathologist [phone]  Leigh Aurora 07/19/2018,1:16 PM

## 2018-07-20 DIAGNOSIS — N3000 Acute cystitis without hematuria: Secondary | ICD-10-CM

## 2018-07-20 DIAGNOSIS — S52021D Displaced fracture of olecranon process without intraarticular extension of right ulna, subsequent encounter for closed fracture with routine healing: Secondary | ICD-10-CM | POA: Diagnosis not present

## 2018-07-20 LAB — GLUCOSE, CAPILLARY
GLUCOSE-CAPILLARY: 100 mg/dL — AB (ref 70–99)
GLUCOSE-CAPILLARY: 67 mg/dL — AB (ref 70–99)
Glucose-Capillary: 68 mg/dL — ABNORMAL LOW (ref 70–99)
Glucose-Capillary: 114 mg/dL — ABNORMAL HIGH (ref 70–99)

## 2018-07-20 NOTE — Progress Notes (Addendum)
PROGRESS NOTE                                                                                                                                                                                                             Patient Demographics:    Casey Lam, is a 85 y.o. female, DOB - [birth year], ZOX:[id number]  Admit date - 07/18/2018   Admitting Physician Lorretta Harp, MD  Outpatient Primary MD for the patient is [name], MD  LOS - 0  Outpatient Specialists:   Chief Complaint  Patient presents with  . Right Arm Injury       Brief Narrative   85 year old female with CKD stage II,?  Advanced dementia, depression hypertension, lives at home with home health presented with right elbow pain.  She was also found to have poor appetite and increasingly disoriented.  No history of trauma or fall.  In the ED she had elevated WBC of 13 K with UA positive for UTI, mild AKI and x-ray of the right elbow showing acute minimally displaced evulsion fracture of her right olecranon process. Orthopedic surgeon consulted and placed on observation.   Subjective:   Patient more awake but quite confused (likely at baseline)   Assessment  & Plan :    Principal Problem: Displaced fracture of olecranon process of right ulna Cannot rule out possible fall.  CT of the head and cervical spine negative for acute injury. Right elbow splint applied in the ED.  Seen by orthopedic surgery Dr. [name] who recommended given her age and severe dementia with the risk of noncompliance and hardware failure from soft bone. Recommended treating with splinting for up to 4-6 weeks and let the elbow to use stiffen up which can provide reasonable function of the right upper arm.  Follow-up with me in the office in 4-6 weeks.  Pain control with Percocet and Robaxin for spasms. Seen by PT and OT and recommend SNF.  Active Problems: Dementia, moderate  to severe with delirium/acute metabolic encephalopathy Likely further triggered by underlying UTI.  Monitor closely.  Urinary tract infection Placed on empiric Rocephin.  Urine culture unfortunately not sent on admission.  Blood cultures negative for growth.     Acute renal failure superimposed on stage 2 chronic kidney disease (HCC) Creatinine increased to 1.12 from baseline of 0.87.  Resolved in  a.m. with IV fluids.  Avoid nephrotoxins.   Essential hypertension Continue atenolol.  Dysphagia with mild to moderate aspiration risk. Seen by SLP and recommends dysphagia level 3 diet with thin liquid.  Admission status: Observation   Code Status : DNR  Family Communication  : None at bedside  Disposition Plan  : Skilled nursing facility, referral sent  Barriers For Discharge : Active symptoms Consults  : [facility] (Dr. Magnus IvanBlackman)  Procedures  : CT head and cervical spine, splinting of right elbow in the ED  DVT Prophylaxis  :  Lovenox   Lab Results  Component Value Date   PLT 288 07/19/2018    Antibiotics  :  Anti-infectives (From admission, onward)   Start     Dose/Rate Route Frequency Ordered Stop   07/19/18 2000  cefTRIAXone (ROCEPHIN) 1 g in sodium chloride 0.9 % 100 mL IVPB     1 g 200 mL/hr over 30 Minutes Intravenous Every 24 hours 07/18/18 2053     07/18/18 2000  cefTRIAXone (ROCEPHIN) 1 g in sodium chloride 0.9 % 100 mL IVPB     1 g 200 mL/hr over 30 Minutes Intravenous  Once 07/18/18 1950 07/18/18 2115        Objective:   Vitals:   07/19/18 1253 07/19/18 2138 07/20/18 0629 07/20/18 1324  BP: 127/78 138/80 (!) 117/94 (!) 128/55  Pulse: 68 75 64 69  Resp: 15 16 16 16   Temp: 97.8 F (36.6 C) 98.1 F (36.7 C) 98.5 F (36.9 C) 98.5 F (36.9 C)  TempSrc: Oral Oral Oral Oral  SpO2: 98% 93% 96% 95%  Weight:      Height:        Wt Readings from Last 3 Encounters:  07/18/18 81.6 kg     Intake/Output Summary (Last 24 hours) at 07/20/2018  1558 Last data filed at 07/20/2018 1328 Gross per 24 hour  Intake 1777.93 ml  Output 700 ml  Net 1077.93 ml    Physical exam Not in distress, confused and poorly communicative HEENT: Moist mucosa, supple neck Chest: Clear bilaterally CVs: Normal S1 and S2 GI: Soft, nondistended, nontender Musculoskeletal: Warm, right elbow splint in place CNS: Oriented x0     Data Review:    CBC Recent Labs  Lab 07/18/18 1710 07/19/18 0348  WBC 13.1* 9.9  HGB 13.6 11.8*  HCT 43.8 38.6  PLT 347 288  MCV 102.3* 105.2*  MCH 31.8 32.2  MCHC 31.1 30.6  RDW 12.9 12.6    Chemistries  Recent Labs  Lab 07/18/18 1710 07/19/18 0348  NA 144 144  K 4.4 3.8  CL 108 111  CO2 27 25  GLUCOSE 112* 105*  BUN 21 22  CREATININE 1.12* 0.88  CALCIUM 9.5 8.6*   ------------------------------------------------------------------------------------------------------------------ No results for input(s): CHOL, HDL, LDLCALC, TRIG, CHOLHDL, LDLDIRECT in the last 72 hours.  No results found for: HGBA1C ------------------------------------------------------------------------------------------------------------------ No results for input(s): TSH, T4TOTAL, T3FREE, THYROIDAB in the last 72 hours.  Invalid input(s): FREET3 ------------------------------------------------------------------------------------------------------------------ No results for input(s): VITAMINB12, FOLATE, FERRITIN, TIBC, IRON, RETICCTPCT in the last 72 hours.  Coagulation profile No results for input(s): INR, PROTIME in the last 168 hours.  No results for input(s): DDIMER in the last 72 hours.  Cardiac Enzymes No results for input(s): CKMB, TROPONINI, MYOGLOBIN in the last 168 hours.  Invalid input(s): CK ------------------------------------------------------------------------------------------------------------------ No results found for: BNP  Inpatient Medications  Scheduled Meds: . atenolol  25 mg Oral Daily  .  doxylamine (Sleep)  1 tablet Oral  QHS  . heparin  5,000 Units Subcutaneous Q8H  . PARoxetine  30 mg Oral Daily  . Ensure Max Protein  11 oz Oral Daily   Continuous Infusions: . sodium chloride 75 mL/hr at 07/20/18 1512  . cefTRIAXone (ROCEPHIN)  IV Stopped (07/19/18 2006)   PRN Meds:.acetaminophen **OR** acetaminophen, hydrALAZINE, methocarbamol, morphine injection, ondansetron **OR** ondansetron (ZOFRAN) IV, oxyCODONE-acetaminophen, senna-docusate  Micro Results Recent Results (from the past 240 hour(s))  Culture, blood (Routine X 2) w Reflex to ID Panel     Status: None (Preliminary result)   Collection Time: 07/18/18  9:58 PM  Result Value Ref Range Status   Specimen Description   Final    BLOOD BLOOD RIGHT HAND Performed at [facility], [address], [place], [place] [address]    Special Requests   Final    BOTTLES DRAWN AEROBIC ONLY Blood Culture results may not be optimal due to an inadequate volume of blood received in culture bottles Performed at [facility], [address] [place], [place] [address]    Culture   Final    NO GROWTH 1 DAY Performed at [facility], [address], [place], [place] [address]    Report Status PENDING  Incomplete  Culture, blood (Routine X 2) w Reflex to ID Panel     Status: None (Preliminary result)   Collection Time: 07/18/18  9:58 PM  Result Value Ref Range Status   Specimen Description   Final    BLOOD RIGHT ANTECUBITAL Performed at [facility], [address], [place], [place] [address]    Special Requests   Final    BOTTLES DRAWN AEROBIC ONLY Blood Culture adequate volume Performed at [facility], [address], [place], [place] [address]    Culture   Final    NO GROWTH 1 DAY Performed at [facility], [address] [place], [place] [address]    Report Status PENDING  Incomplete    Radiology Reports Dg Elbow Complete  Right  Result Date: 07/18/2018 CLINICAL DATA:  History of dementia, now with right elbow pain and edema. EXAM: RIGHT ELBOW - COMPLETE 3+ VIEW COMPARISON:  Right humerus and forearm radiographs-earlier same day FINDINGS: There is an acute displaced avulsion fracture of the olecranon process with associated foreshortening and adjacent soft tissue swelling. No additional fractures identified given obliquity. No radiopaque foreign body. IMPRESSION: 1. Acute minimally displaced avulsion fracture of the olecranon process with associated foreshortening. 2. No additional fractures identified. Electronically Signed   By: Simonne Come M.D.   On: 07/18/2018 16:28   Dg Forearm Right  Result Date: 07/18/2018 CLINICAL DATA:  History of dementia, now with right upper extremity edema. EXAM: RIGHT FOREARM - 2 VIEW COMPARISON:  None. FINDINGS: There is an acute minimally displaced avulsion fracture involving the olecranon process with associated foreshortening and adjacent soft tissue swelling. No radiopaque foreign body. No additional fracture identified within the mid and distal aspects of the radius or ulna. Mild degenerative change involving the STT joints of the base of the thumb, incompletely evaluated. IMPRESSION: Acute, minimally displaced avulsion fracture of the olecranon process. Further evaluation with dedicated elbow radiographs could be performed as indicated. Electronically Signed   By: Simonne Come M.D.   On: 07/18/2018 15:41   Ct Head Wo Contrast  Result Date: 07/18/2018 CLINICAL DATA:  Trauma EXAM: CT HEAD WITHOUT CONTRAST CT CERVICAL SPINE WITHOUT CONTRAST TECHNIQUE: Multidetector CT imaging of the head and  cervical spine was performed following the standard protocol without intravenous contrast. Multiplanar CT image reconstructions of the cervical spine were also generated. COMPARISON:  None. FINDINGS: CT HEAD FINDINGS Brain: No evidence of acute infarction, hemorrhage, hydrocephalus, extra-axial collection  or mass lesion/mass effect. Periventricular white matter disease and global volume loss. Vascular: No hyperdense vessel or unexpected calcification. Skull: Normal. Negative for fracture or focal lesion. Sinuses/Orbits: No acute finding. Other: None. CT CERVICAL SPINE FINDINGS Alignment: Normal. Skull base and vertebrae: There is a subtle, likely acute superior endplate deformity of C7 without significant fracture fragment pulsion. No primary bone lesion or focal pathologic process. Moderate to severe multilevel facet degenerative disease. Soft tissues and spinal canal: No prevertebral fluid or swelling. No visible canal hematoma. Disc levels: Moderate multilevel disc and facet degenerative disease, worst at C3 through C5. Upper chest: Negative. Other: None. IMPRESSION: 1. No acute intracranial pathology. Small-vessel white matter disease and volume loss in keeping with advanced patient age. 2. Subtle, likely acute superior endplate deformity of C7 without significant fracture fragment pulsion. Correlate for referable pain. MRI may be used to further evaluate for marrow edema and fracture acuity if desired. Electronically Signed   By: Lauralyn Primes M.D.   On: 07/18/2018 16:24   Ct Cervical Spine Wo Contrast  Result Date: 07/18/2018 CLINICAL DATA:  Trauma EXAM: CT HEAD WITHOUT CONTRAST CT CERVICAL SPINE WITHOUT CONTRAST TECHNIQUE: Multidetector CT imaging of the head and cervical spine was performed following the standard protocol without intravenous contrast. Multiplanar CT image reconstructions of the cervical spine were also generated. COMPARISON:  None. FINDINGS: CT HEAD FINDINGS Brain: No evidence of acute infarction, hemorrhage, hydrocephalus, extra-axial collection or mass lesion/mass effect. Periventricular white matter disease and global volume loss. Vascular: No hyperdense vessel or unexpected calcification. Skull: Normal. Negative for fracture or focal lesion. Sinuses/Orbits: No acute finding. Other:  None. CT CERVICAL SPINE FINDINGS Alignment: Normal. Skull base and vertebrae: There is a subtle, likely acute superior endplate deformity of C7 without significant fracture fragment pulsion. No primary bone lesion or focal pathologic process. Moderate to severe multilevel facet degenerative disease. Soft tissues and spinal canal: No prevertebral fluid or swelling. No visible canal hematoma. Disc levels: Moderate multilevel disc and facet degenerative disease, worst at C3 through C5. Upper chest: Negative. Other: None. IMPRESSION: 1. No acute intracranial pathology. Small-vessel white matter disease and volume loss in keeping with advanced patient age. 2. Subtle, likely acute superior endplate deformity of C7 without significant fracture fragment pulsion. Correlate for referable pain. MRI may be used to further evaluate for marrow edema and fracture acuity if desired. Electronically Signed   By: Lauralyn Primes M.D.   On: 07/18/2018 16:24   Dg Chest Portable 1 View  Result Date: 07/18/2018 CLINICAL DATA:  Weakness EXAM: PORTABLE CHEST 1 VIEW COMPARISON:  None. FINDINGS: Two lead left subclavian pacemaker is noted with lead tips overlying the right atrium and right ventricle. Mild cardiomegaly. Minimally tortuous atherosclerotic thoracic aorta. Otherwise normal mediastinal contour. No pneumothorax. No pleural effusion. Lungs appear clear, with no acute consolidative airspace disease and no pulmonary edema. IMPRESSION: Mild cardiomegaly without pulmonary edema. No active pulmonary disease. Electronically Signed   By: Delbert Phenix M.D.   On: 07/18/2018 20:04   Dg Humerus Right  Result Date: 07/18/2018 CLINICAL DATA:  History of dementia, now with deformity involving the right arm. EXAM: RIGHT HUMERUS - 2+ VIEW COMPARISON:  Right forearm radiographs-earlier same day FINDINGS: There is an acute minimally displaced avulsion fracture involving the olecranon  process with associated foreshortening and adjacent soft tissue  swelling. No radiopaque foreign body. No additional fracture identified within the proximal humerus. IMPRESSION: Acute, minimally displaced avulsion fracture of the olecranon process. Further evaluation with dedicated elbow radiographs could be performed as indicated. Electronically Signed   By: Simonne Come M.D.   On: 07/18/2018 15:40    Time Spent in minutes  25   Travon Crochet M.D on 07/20/2018 at 3:58 PM  Between 7am to 7pm - Pager - [phone]  After 7pm go to [email or web address] - password [facility]  [facility] -  Office  [phone]

## 2018-07-20 NOTE — TOC Initial Note (Signed)
Transition of Care [facility]) - Initial/Assessment Note    Patient Details  Name: Casey Lam MRN: [id number] Date of Birth: 02/20/1926  Transition of Care [facility]) CM/SW Contact:    Althea Charon, LCSW Phone Number: 07/20/2018, 2:14 PM  Clinical Narrative:  CSW spoke with patients daughter via phone. Daughter [name] stated that patient lives at home with her and her husband. Charmaine stated she is agreeable for patient to discharge to a SNF but stated she would like to look at her options since she has never sent patient to a rehab facility. CSW went over the process of SNF. CSW stated she will reach back out to family once bed offers have been made.              Expected Discharge Plan: Skilled Nursing Facility Barriers to Discharge: English as a second language teacher, Continued Medical Work up, SNF Pending bed offer   Patient Goals and CMS Choice Patient states their goals for this hospitalization and ongoing recovery are:: spoke with pt daughter, wants pt to be safe CMS Medicare.gov Compare Post Acute Care list provided to:: Other (Comment Required)(given to family) Choice offered to / list presented to : Adult Children  Expected Discharge Plan and Services Expected Discharge Plan: Skilled Nursing Facility   Post Acute Care Choice: Skilled Nursing Facility Living arrangements for the past 2 months: Single Family Home Expected Discharge Date: 07/21/18                        Prior Living Arrangements/Services Living arrangements for the past 2 months: Single Family Home Lives with:: Adult Children Patient language and need for interpreter reviewed:: No Do you feel safe going back to the place where you live?: Yes      Need for Family Participation in Patient Care: Yes (Comment) Care giver support system in place?: Yes (comment) Current home services: Other (comment) Criminal Activity/Legal Involvement Pertinent to Current Situation/Hospitalization: No - Comment as needed  Activities  of Daily Living Home Assistive Devices/Equipment: Other (Comment)(unable to assess-dementia) ADL Screening (condition at time of admission) Patient's cognitive ability adequate to safely complete daily activities?: No Is the patient deaf or have difficulty hearing?: Yes Does the patient have difficulty seeing, even when wearing glasses/contacts?: No Does the patient have difficulty concentrating, remembering, or making decisions?: Yes Patient able to express need for assistance with ADLs?: Yes Does the patient have difficulty dressing or bathing?: Yes Independently performs ADLs?: No Communication: Needs assistance Is this a change from baseline?: Pre-admission baseline Dressing (OT): Dependent Is this a change from baseline?: Pre-admission baseline Grooming: Dependent Is this a change from baseline?: Pre-admission baseline Feeding: Needs assistance Is this a change from baseline?: Pre-admission baseline Bathing: Dependent Is this a change from baseline?: Pre-admission baseline Toileting: Dependent Is this a change from baseline?: Pre-admission baseline In/Out Bed: Needs assistance Is this a change from baseline?: Pre-admission baseline Walks in Home: Needs assistance Is this a change from baseline?: Pre-admission baseline Does the patient have difficulty walking or climbing stairs?: Yes Weakness of Legs: Both Weakness of Arms/Hands: Both  Permission Sought/Granted Permission sought to share information with : Family Supports Permission granted to share information with : Yes, Verbal Permission Granted  Share Information with NAME: Lynford Citizen  Permission granted to share info w AGENCY: snf  Permission granted to share info w Relationship: daughter  Permission granted to share info w Contact Information: [phone]  Emotional Assessment Appearance:: Appears stated age Attitude/Demeanor/Rapport: Unable to Assess Affect (typically observed): Unable  to  Assess Orientation: : Oriented to Self Alcohol / Substance Use: Not Applicable Psych Involvement: No (comment)  Admission diagnosis:  Acute cystitis with hematuria [N30.01] Closed fracture of right olecranon process, initial encounter [S52.021A] Patient Active Problem List   Diagnosis Date Noted  . Dementia (HCC) 07/18/2018  . Hypertension 07/18/2018  . UTI (urinary tract infection) 07/18/2018  . Acute renal failure superimposed on stage 2 chronic kidney disease (HCC) 07/18/2018  . Fracture of olecranon process of right ulna 07/18/2018  . Fall 07/18/2018   PCP:  [name], MD Pharmacy:   [facility] [phone] - [place], [place] - [address] [facility] [address] [place] [address] Phone: [phone] Fax: [phone]     Social Determinants of Health (SDOH) Interventions    Readmission Risk Interventions  No flowsheet data found.

## 2018-07-20 NOTE — Progress Notes (Signed)
Occupational Therapy Treatment Patient Details Name: Casey Lam MRN: [id number] DOB: 1925-06-06 Today's Date: 07/20/2018    History of present illness 85 y.o. female with medical history significant of hypertension, depression, dementia, CKD stage II, who presents with right elbow pain. Dx of UTI, R olecranon fx. Conservative tx.    OT comments  Aed pt back to bed.  Pt had been in chair since yesterday afternoon.   Follow Up Recommendations  SNF    Equipment Recommendations  None recommended by OT    Recommendations for Other Services      Precautions / Restrictions Precautions Precautions: Fall Required Braces or Orthoses: Sling Restrictions Weight Bearing Restrictions: No Other Position/Activity Restrictions: assume NWB RUE       Mobility Bed Mobility   Bed Mobility: Sit to Supine       Sit to supine: +2 for physical assistance;Max assist;+2 for safety/equipment      Transfers     Transfers: Sit to/from [employer] Sit to Stand: +2 safety/equipment;+2 physical assistance Stand pivot transfers: +2 safety/equipment;+2 physical assistance            Balance Overall balance assessment: Needs assistance Sitting-balance support: Single extremity supported;Feet supported Sitting balance-Leahy Scale: Good       Standing balance-Leahy Scale: Fair                             ADL either performed or assessed with clinical judgement   ADL Overall ADL's : Needs assistance/impaired                         Toilet Transfer: +2 for safety/equipment;+2 for physical assistance;Moderate assistance;Stand-pivot;Cueing for sequencing;Cueing for safety Toilet Transfer Details (indicate cue type and reason): chair to bed           General ADL Comments: pt had been in chair all night.  OT and RN Aed pt back to bed.  Pt briefly agitated but was agreeable.                 Cognition Arousal/Alertness: Awake/alert Behavior  During Therapy: Agitated Overall Cognitive Status: No family/caregiver present to determine baseline cognitive functioning                                                General Comments    Educated Rn on positioning of RUE with sling and with pillow behind RUE for support. Encouraged pt to move fingers. Pt complied.        Frequency  Min 2X/week        Progress Toward Goals  OT Goals(current goals can now be found in the care plan section)  Progress towards OT goals: Progressing toward goals     Plan Discharge plan remains appropriate       AM-PAC OT "6 Clicks" Daily Activity     Outcome Measure   Help from another person eating meals?: A Little Help from another person taking care of personal grooming?: A Little Help from another person toileting, which includes using toliet, bedpan, or urinal?: A Lot Help from another person bathing (including washing, rinsing, drying)?: A Lot Help from another person to put on and taking off regular upper body clothing?: A Lot Help from another person to put on and taking off regular lower  body clothing?: A Lot 6 Click Score: 14    End of Session    OT Visit Diagnosis: History of falling (Z91.81);Muscle weakness (generalized) (M62.81)   Activity Tolerance Patient tolerated treatment well   Patient Left in bed   Nurse Communication Other (comment)(positioning of sling)        Time: 6754-4920 OT Time Calculation (min): 17 min  Charges: OT General Charges $OT Visit: 1 Visit OT Treatments $Self Care/Home Management : 8-22 mins  Lise Auer, OT Acute Rehabilitation Services [phone] Office- [phone]      Mert Dietrick, Karin Golden D 07/20/2018, 11:51 AM

## 2018-07-20 NOTE — Care Management Obs Status (Signed)
MEDICARE OBSERVATION STATUS NOTIFICATION   Patient Details  Name: Casey Lam MRN: [id number] Date of Birth: [birth year]   Medicare Observation Status Notification Given:  Yes    Althea Charon, LCSW 07/20/2018, 2:08 PM

## 2018-07-21 DIAGNOSIS — F0390 Unspecified dementia without behavioral disturbance: Secondary | ICD-10-CM | POA: Diagnosis not present

## 2018-07-21 DIAGNOSIS — S52021D Displaced fracture of olecranon process without intraarticular extension of right ulna, subsequent encounter for closed fracture with routine healing: Secondary | ICD-10-CM | POA: Diagnosis not present

## 2018-07-21 DIAGNOSIS — I10 Essential (primary) hypertension: Secondary | ICD-10-CM | POA: Diagnosis not present

## 2018-07-21 DIAGNOSIS — N179 Acute kidney failure, unspecified: Secondary | ICD-10-CM | POA: Diagnosis not present

## 2018-07-21 LAB — GLUCOSE, CAPILLARY
Glucose-Capillary: 64 mg/dL — ABNORMAL LOW (ref 70–99)
Glucose-Capillary: 69 mg/dL — ABNORMAL LOW (ref 70–99)

## 2018-07-21 MED ORDER — TRAMADOL HCL 50 MG PO TABS: 50.0000 mg | ORAL_TABLET | Freq: Four times a day (QID) | ORAL | Status: DC | PRN

## 2018-07-21 MED ORDER — ENSURE ENLIVE PO LIQD
237.0000 mL | Freq: Two times a day (BID) | ORAL | Status: DC
  Administered 2018-07-21 – 2018-07-22 (×2): 237 mL via ORAL

## 2018-07-21 MED ORDER — OXYCODONE-ACETAMINOPHEN 5-325 MG PO TABS
1.0000 | ORAL_TABLET | ORAL | Status: DC | PRN
  Filled 2018-07-21: qty 1

## 2018-07-21 NOTE — Progress Notes (Signed)
PROGRESS NOTE    Casey Lam  ZOX:[id number] DOB: June 24, 1925 DOA: 07/18/2018 PCP: [name], MD     Brief Narrative: 85 year old female with CKD stage II,?  Advanced dementia, depression hypertension, lives at home with home health presented with right elbow pain.  She was also found to have poor appetite and increasingly disoriented.  No history of trauma or fall.  In the ED she had elevated WBC of 13 K with UA positive for UTI, mild AKI and x-ray of the right elbow showing acute minimally displaced evulsion fracture of her right olecranon process. Orthopedic surgeon consulted and placed on observation.  Assessment & Plan:   Principal Problem:   Fracture of olecranon process of right ulna Active Problems:   Dementia (HCC)   Hypertension   UTI (urinary tract infection)   Acute renal failure superimposed on stage 2 chronic kidney disease (HCC)   Fall  Displaced fracture of the olecranon process of the right ulna: Orthopedics consulted and recommendations were splinting for up to 4-6 weeks and let the elbow to use stiffen up which can provide reasonable function of the right upper arm.  Dementia;  Stable.    UTI:  On rocephin .    AKI; resolved.    Hypertension:  Controlled.    Dysphagia - SLP  Recommending dysphagia 3 diet.     DVT prophylaxis: (LOVENOX.  Code Status: DNR Family Communication: daughter at bedside.  Disposition Plan: pending SNF.    Consultants:   Orthopedics.   Procedures: none.  Antimicrobials: ROCEPHIN FOR UTI.   Subjective: No chest pain or sob.   Objective: Vitals:   07/20/18 1324 07/20/18 2046 07/21/18 0453 07/21/18 1317  BP: (!) 128/55 124/73 (!) 141/77 114/66  Pulse: 69 71 68 72  Resp: 16 15 15 16   Temp: 98.5 F (36.9 C) 98.5 F (36.9 C) 98.3 F (36.8 C) 97.9 F (36.6 C)  TempSrc: Oral Oral Oral Oral  SpO2: 95% 93% 96% 95%  Weight:      Height:        Intake/Output Summary (Last 24 hours) at 07/21/2018  1352 Last data filed at 07/21/2018 1322 Gross per 24 hour  Intake 1992.37 ml  Output 450 ml  Net 1542.37 ml   Filed Weights   07/18/18 1455  Weight: 81.6 kg    Examination:  General exam: Appears calm and comfortable  Respiratory system: Clear to auscultation. Respiratory effort normal. Cardiovascular system: S1 & S2 heard, RRR. No JVD,  Gastrointestinal system: Abdomen is nondistended, soft and nontender. No organomegaly or masses felt. Normal bowel sounds heard. Central nervous system: Alert and oriented. No focal neurological deficits. Extremities: right arm is in splint Skin: No rashes, lesions or ulcers Psychiatry Mood & affect appropriate.     Data Reviewed: I have personally reviewed following labs and imaging studies  CBC: Recent Labs  Lab 07/18/18 1710 07/19/18 0348  WBC 13.1* 9.9  HGB 13.6 11.8*  HCT 43.8 38.6  MCV 102.3* 105.2*  PLT 347 288   Basic Metabolic Panel: Recent Labs  Lab 07/18/18 1710 07/19/18 0348  NA 144 144  K 4.4 3.8  CL 108 111  CO2 27 25  GLUCOSE 112* 105*  BUN 21 22  CREATININE 1.12* 0.88  CALCIUM 9.5 8.6*   GFR: Estimated Creatinine Clearance: 46.7 mL/min (by C-G formula based on SCr of 0.88 mg/dL). Liver Function Tests: No results for input(s): AST, ALT, ALKPHOS, BILITOT, PROT, ALBUMIN in the last 168 hours. No results for input(s): LIPASE,  AMYLASE in the last 168 hours. No results for input(s): AMMONIA in the last 168 hours. Coagulation Profile: No results for input(s): INR, PROTIME in the last 168 hours. Cardiac Enzymes: No results for input(s): CKTOTAL, CKMB, CKMBINDEX, TROPONINI in the last 168 hours. BNP (last 3 results) No results for input(s): PROBNP in the last 8760 hours. HbA1C: No results for input(s): HGBA1C in the last 72 hours. CBG: Recent Labs  Lab 07/20/18 0844 07/20/18 0911 07/20/18 1145 07/21/18 0735 07/21/18 0807  GLUCAP 67* 100* 114* 64* 69*   Lipid Profile: No results for input(s): CHOL,  HDL, LDLCALC, TRIG, CHOLHDL, LDLDIRECT in the last 72 hours. Thyroid Function Tests: No results for input(s): TSH, T4TOTAL, FREET4, T3FREE, THYROIDAB in the last 72 hours. Anemia Panel: No results for input(s): VITAMINB12, FOLATE, FERRITIN, TIBC, IRON, RETICCTPCT in the last 72 hours. Sepsis Labs: No results for input(s): PROCALCITON, LATICACIDVEN in the last 168 hours.  Recent Results (from the past 240 hour(s))  Culture, blood (Routine X 2) w Reflex to ID Panel     Status: None (Preliminary result)   Collection Time: 07/18/18  9:58 PM  Result Value Ref Range Status   Specimen Description   Final    BLOOD BLOOD RIGHT HAND Performed at [facility], [address] [place], [place] [address]    Special Requests   Final    BOTTLES DRAWN AEROBIC ONLY Blood Culture results may not be optimal due to an inadequate volume of blood received in culture bottles Performed at [facility], [address], [place], [place] [address]    Culture   Final    NO GROWTH 2 DAYS Performed at [facility], [address], [place], [place] [address]    Report Status PENDING  Incomplete  Culture, blood (Routine X 2) w Reflex to ID Panel     Status: None (Preliminary result)   Collection Time: 07/18/18  9:58 PM  Result Value Ref Range Status   Specimen Description   Final    BLOOD RIGHT ANTECUBITAL Performed at [facility], [address] [place], [place] [address]    Special Requests   Final    BOTTLES DRAWN AEROBIC ONLY Blood Culture adequate volume Performed at [facility], [address] [place], [place] [address]    Culture   Final    NO GROWTH 2 DAYS Performed at [facility], [address], [place], [place] [address]    Report Status PENDING  Incomplete         Radiology Studies: No results found.      Scheduled Meds: . atenolol  25 mg Oral Daily  . doxylamine (Sleep)  1 tablet Oral QHS   . heparin  5,000 Units Subcutaneous Q8H  . PARoxetine  30 mg Oral Daily  . Ensure Max Protein  11 oz Oral Daily   Continuous Infusions: . cefTRIAXone (ROCEPHIN)  IV Stopped (07/20/18 1957)     LOS: 0 days    Time spent: 34 minutes.     Kathlen Mody, MD [facility] Pager [phone]  If 7PM-7AM, please contact night-coverage [email or web address] Password [facility] 07/21/2018, 1:52 PM

## 2018-07-21 NOTE — Progress Notes (Signed)
Clinical Social Worker following patient for support and discharge need. CSW met patient and patients daughter Charmaine at bedside. Charmaine stated she has chosen [facility] as the facility she would like patient to go to for rehab. Admission Coordinator for [facility] has started authorization through insurance. Admission coordinator stated she will reach back out to CSW once authorization has been obtained.   Rhea Pink, MSW,  [place]

## 2018-07-21 NOTE — Progress Notes (Signed)
  Speech Language Pathology Treatment: Dysphagia  Patient Details Name: Casey Lam MRN: [id number] DOB: 1925-10-26 Today's Date: 07/21/2018 Time: 3875-6433 SLP Time Calculation (min) (ACUTE ONLY): 14 min  Assessment / Plan / Recommendation Clinical Impression  Pt seen to assess po tolerance - Per NT, pt reports she is not consuming Ensure but daughter told NT she enjoys it.  Provided pt with cereal bar - which she was able to hold and self feed with left hand.  Pt naturally took small bites and masticated well as SLP assisted her to remove wrapper with mod cues.  Pt declined to consume liquids despite multiple efforts to encourage her to take to clear cereal bar.  Oral cavity was clear however without residuals.  No family present at this time.  Recommend pt be allowed soft sandwiches to encourage her to self feed.  Suspect her swallow is near baseline.     HPI HPI: 85 year old female admitted 07/18/2018 with right UE/elbow pain, likely due to a fall. PMH: dementia, HTN, depression, CKD2      SLP Plan  Continue with current plan of care       Recommendations  Diet recommendations: Dysphagia 3 (mechanical soft);Thin liquid(finger foods ok) Medication Administration: (as tolerated) Supervision: Patient able to self feed(finger foods, soft sandwiches) Compensations: Slow rate;Small sips/bites Postural Changes and/or Swallow Maneuvers: Seated upright 90 degrees                Oral Care Recommendations: Oral care BID Follow up Recommendations: (tbd) SLP Visit Diagnosis: Dysphagia, unspecified (R13.10) Plan: Continue with current plan of care       GO               [name], MS [facility] SLP Acute Rehab Services Pager [phone] Office [phone]  Chales Abrahams 07/21/2018, 4:18 PM

## 2018-07-21 NOTE — NC FL2 (Signed)
[place] MEDICAID FL2 LEVEL OF CARE SCREENING TOOL     IDENTIFICATION  Patient Name: Casey Lam Birthdate: [birth year] Sex: female Admission Date (Current Location): 07/18/2018  [facility] and [place] Number:  Producer, television/film/video and Address:  [facility],  [address] [place], [place] [address]      Provider Number: [id number]  Attending Physician Name and Address:  Kathlen Mody, MD  Relative Name and Phone Number:  Lynford Citizen, [phone]    Current Level of Care: Hospital Recommended Level of Care: Skilled Nursing Facility Prior Approval Number:    Date Approved/Denied:   PASRR Number: [id number]  Discharge Plan: SNF    Current Diagnoses: Patient Active Problem List   Diagnosis Date Noted  . Dementia (HCC) 07/18/2018  . Hypertension 07/18/2018  . UTI (urinary tract infection) 07/18/2018  . Acute renal failure superimposed on stage 2 chronic kidney disease (HCC) 07/18/2018  . Fracture of olecranon process of right ulna 07/18/2018  . Fall 07/18/2018    Orientation RESPIRATION BLADDER Height & Weight     Self  Normal External catheter, Incontinent Weight: 180 lb (81.6 kg) Height:  5\' 7"  (170.2 cm)  BEHAVIORAL SYMPTOMS/MOOD NEUROLOGICAL BOWEL NUTRITION STATUS      Incontinent Diet(dsy 3)  AMBULATORY STATUS COMMUNICATION OF NEEDS Skin   Extensive Assist Verbally(make noises) Normal                       Personal Care Assistance Level of Assistance  Bathing, Feeding, Dressing Bathing Assistance: Maximum assistance Feeding assistance: Limited assistance Dressing Assistance: Maximum assistance     Functional Limitations Info  Sight, Hearing, Speech Sight Info: Adequate Hearing Info: Adequate Speech Info: Impaired    SPECIAL CARE FACTORS FREQUENCY  PT (By licensed PT), OT (By licensed OT)     PT Frequency: 5x wk OT Frequency: 5x wk            Contractures Contractures Info: Not present    Additional Factors Info  Code  Status, Allergies Code Status Info: dnr Allergies Info: lorazepam           Current Medications (07/21/2018):  This is the current hospital active medication list Current Facility-Administered Medications  Medication Dose Route Frequency Provider Last Rate Last Dose  . 0.9 %  sodium chloride infusion   Intravenous Continuous Lorretta Harp, MD 75 mL/hr at 07/21/18 0345    . acetaminophen (TYLENOL) tablet 650 mg  650 mg Oral Q6H PRN Lorretta Harp, MD       Or  . acetaminophen (TYLENOL) suppository 650 mg  650 mg Rectal Q6H PRN Lorretta Harp, MD      . atenolol (TENORMIN) tablet 25 mg  25 mg Oral Daily Lorretta Harp, MD   25 mg at 07/21/18 4580  . cefTRIAXone (ROCEPHIN) 1 g in sodium chloride 0.9 % 100 mL IVPB  1 g Intravenous Q24H Lorretta Harp, MD   Stopped at 07/20/18 1957  . doxylamine (Sleep) (UNISOM) tablet 25 mg  1 tablet Oral QHS Lorretta Harp, MD   25 mg at 07/20/18 2219  . heparin injection 5,000 Units  5,000 Units Subcutaneous Alean Rinne, MD   5,000 Units at 07/21/18 9983  . hydrALAZINE (APRESOLINE) injection 5 mg  5 mg Intravenous Q2H PRN Lorretta Harp, MD      . methocarbamol (ROBAXIN) tablet 500 mg  500 mg Oral Q8H PRN Lorretta Harp, MD      . morphine 2 MG/ML injection 0.5 mg  0.5 mg Intravenous  Q3H PRN Dhungel, Nishant, MD   0.5 mg at 07/20/18 1607  . ondansetron (ZOFRAN) tablet 4 mg  4 mg Oral Q6H PRN Lorretta Harp, MD       Or  . ondansetron [facility]) injection 4 mg  4 mg Intravenous Q6H PRN Lorretta Harp, MD      . oxyCODONE-acetaminophen (PERCOCET/ROXICET) 5-325 MG per tablet 1 tablet  1 tablet Oral Q4H PRN Lorretta Harp, MD   1 tablet at 07/20/18 2219  . PARoxetine (PAXIL) tablet 30 mg  30 mg Oral Daily Lorretta Harp, MD   30 mg at 07/21/18 5436  . protein supplement (ENSURE MAX) liquid  11 oz Oral Daily Lorretta Harp, MD   11 oz at 07/20/18 1001  . senna-docusate (Senokot-S) tablet 1 tablet  1 tablet Oral QHS PRN Lorretta Harp, MD         Discharge Medications: Please see discharge summary for a list of  discharge medications.  Relevant Imaging Results:  Relevant Lab Results:   Additional Information SS# [id number]  Althea Charon, LCSW

## 2018-07-22 DIAGNOSIS — N179 Acute kidney failure, unspecified: Secondary | ICD-10-CM | POA: Diagnosis not present

## 2018-07-22 DIAGNOSIS — S52021D Displaced fracture of olecranon process without intraarticular extension of right ulna, subsequent encounter for closed fracture with routine healing: Secondary | ICD-10-CM | POA: Diagnosis not present

## 2018-07-22 DIAGNOSIS — N3000 Acute cystitis without hematuria: Secondary | ICD-10-CM | POA: Diagnosis not present

## 2018-07-22 DIAGNOSIS — I10 Essential (primary) hypertension: Secondary | ICD-10-CM | POA: Diagnosis not present

## 2018-07-22 LAB — GLUCOSE, CAPILLARY: Glucose-Capillary: 68 mg/dL — ABNORMAL LOW (ref 70–99)

## 2018-07-22 MED ORDER — SENNOSIDES-DOCUSATE SODIUM 8.6-50 MG PO TABS: 1.0000 | ORAL_TABLET | Freq: Every evening | ORAL | Status: DC | PRN

## 2018-07-22 MED ORDER — ENSURE MAX PROTEIN PO LIQD: 11.0000 [oz_av] | Freq: Every day | ORAL | Status: DC

## 2018-07-22 MED ORDER — ACETAMINOPHEN 325 MG PO TABS: 650.0000 mg | ORAL_TABLET | Freq: Four times a day (QID) | ORAL | Status: AC | PRN

## 2018-07-22 MED ORDER — CEPHALEXIN 500 MG PO CAPS: 500.0000 mg | ORAL_CAPSULE | Freq: Two times a day (BID) | ORAL | 0 refills | Status: AC

## 2018-07-22 MED ORDER — ENSURE ENLIVE PO LIQD: 237.0000 mL | Freq: Two times a day (BID) | ORAL | 12 refills | Status: AC

## 2018-07-22 MED ORDER — TRAMADOL HCL 50 MG PO TABS: 50.0000 mg | ORAL_TABLET | Freq: Four times a day (QID) | ORAL | 0 refills | Status: AC | PRN

## 2018-07-22 MED ORDER — METHOCARBAMOL 500 MG PO TABS: 500.0000 mg | ORAL_TABLET | Freq: Three times a day (TID) | ORAL | 0 refills | Status: AC | PRN

## 2018-07-22 NOTE — TOC Transition Note (Signed)
Transition of Care [facility]) - CM/SW Discharge Note   Patient Details  Name: Casey Lam MRN: [id number] Date of Birth: 1926-05-06  [facility]) CM/SW Contact:  Althea Charon, LCSW Phone Number: 07/22/2018, 10:37 AM   Clinical Narrative:   Patient go to [facility]. Patient will go via PTAR. RN to call [phone] for report. Patient to go to rm# [id number]    Final next level of care: Skilled Nursing Facility([facility]) Barriers to Discharge: No Barriers Identified   Patient Goals and CMS Choice Patient states their goals for this hospitalization and ongoing recovery are:: spoke with pt daughter, wants pt to be safe CMS Medicare.gov Compare Post Acute Care list provided to:: Other (Comment Required)(given to family) Choice offered to / list presented to : Adult Children  Discharge Placement PASRR number recieved: 07/22/18 Existing PASRR number confirmed : 07/22/18          Patient chooses bed at: [facility] Patient to be transferred to facility by: ptar Name of family member notified: spoke with daughter via phone  Patient and family notified of of transfer: 07/22/18  Discharge Plan and Services   Post Acute Care Choice: Skilled Nursing Facility                    Social Determinants of Health (SDOH) Interventions     Readmission Risk Interventions No flowsheet data found.

## 2018-07-22 NOTE — Discharge Summary (Signed)
Physician Discharge Summary  Casey Lam UJW:[id number] DOB: January 21, 1926 DOA: 07/18/2018  PCP: Casey Inch, MD  Admit date: 07/18/2018 Discharge date: 07/22/2018  Admitted From: Home.  Disposition:  [facility]   Recommendations for Outpatient Follow-up:  1. Follow up with PCP in 1-2 weeks 2. Please obtain BMP/CBC in one week Please follow up  With Dr Casey Lam ORTHOPEDICS as recommended in 4 to 6 weeks.   Discharge Condition: Guarded.  CODE STATUS:DNR  Diet recommendation: Dysphagia 3 (mechanical soft);Thin liquid(finger foods ok) Medication Administration: (as tolerated) Supervision: Patient able to self feed(finger foods, soft sandwiches) Compensations: Slow rate;Small sips/bites Postural Changes and/or Swallow Maneuvers: Seated upright 90 degrees  Brief/Interim Summary: 85 year old female with CKD stage II,?  Advanced dementia, depression hypertension, lives at home with home health presented with right elbow pain.  She was also found to have poor appetite and increasingly disoriented.  No history of trauma or fall.  In the ED she had elevated WBC of 13 K with UA positive for UTI, mild AKI and x-ray of the right elbow showing acute minimally displaced evulsion fracture of her right olecranon process. Orthopedic surgeon consulted and placed on observation.  Discharge Diagnoses:  Principal Problem:   Fracture of olecranon process of right ulna Active Problems:   Dementia (HCC)   Hypertension   UTI (urinary tract infection)   Acute renal failure superimposed on stage 2 chronic kidney disease (HCC)   Fall  Displaced fracture of olecranon process of right ulna Cannot rule out possible fall.  CT of the head and cervical spine negative for acute injury. Right elbow splint applied in the ED.  Seen by orthopedic surgery Dr. Magnus Lam who recommended given her age and severe dementia with the risk of noncompliance and hardware failure from soft bone. Recommended treating with  splinting for up to 4-6 weeks and let the elbow to use stiffen up which can provide reasonable function of the right upper arm.   Follow-up with Dr Casey Lam in the office in 4-6 weeks.  Pain control with tramadol and Robaxin for spasms. Seen by PT and OT and recommend SNF.   Dementia, moderate to severe with delirium/acute metabolic encephalopathy Likely further triggered by underlying UTI.  Monitor closely.  Urinary tract infection Placed on empiric Rocephin.  Urine culture unfortunately not sent on admission.  Blood cultures negative for growth. Discharged on keflex to complete the course.      Acute renal failure superimposed on stage 2 chronic kidney disease (HCC) Creatinine increased to 1.12 from baseline of 0.87. resolved with IV fluids.   Essential hypertension Continue atenolol.  Dysphagia with mild to moderate aspiration risk. Seen by SLP and recommends dysphagia level 3 diet with thin liquid.   Discharge Instructions  Discharge Instructions    Diet - low sodium heart healthy   Complete by:  As directed    Discharge instructions   Complete by:  As directed    Please follow up with orthopedics as recommended.     Allergies as of 07/22/2018      Reactions   Lorazepam Other (See Comments)      Medication List    TAKE these medications   acetaminophen 325 MG tablet Commonly known as:  TYLENOL Take 2 tablets (650 mg total) by mouth every 6 (six) hours as needed for mild pain (or Fever >/= 101).   atenolol 25 MG tablet Commonly known as:  TENORMIN Take 25 mg by mouth daily.   cephALEXin 500 MG capsule Commonly known as:  KEFLEX Take 1 capsule (500 mg total) by mouth 2 (two) times daily for 3 days.   doxylamine (Sleep) 25 MG tablet Commonly known as:  UNISOM Take 1 tablet by mouth at bedtime.   feeding supplement (ENSURE ENLIVE) Liqd Take 237 mLs by mouth 2 (two) times daily between meals.   Ensure Max Protein Liqd Take 330 mLs (11 oz total)  by mouth daily.   methocarbamol 500 MG tablet Commonly known as:  ROBAXIN Take 1 tablet (500 mg total) by mouth every 8 (eight) hours as needed for muscle spasms.   PARoxetine 30 MG tablet Commonly known as:  PAXIL Take 30 mg by mouth daily.   senna-docusate 8.6-50 MG tablet Commonly known as:  Senokot-S Take 1 tablet by mouth at bedtime as needed for mild constipation.   traMADol 50 MG tablet Commonly known as:  ULTRAM Take 1 tablet (50 mg total) by mouth every 6 (six) hours as needed for up to 3 days for moderate pain.      Follow-up Information    [name], MD. Schedule an appointment as soon as possible for a visit in 1 week(s).   Specialty:  Orthopedic Surgery Contact information: [address] [place] [address] [phone]          Allergies  Allergen Reactions  . Lorazepam Other (See Comments)    Consultations:  Dr Casey Lam.    Procedures/Studies: Dg Elbow Complete Right  Result Date: 07/18/2018 CLINICAL DATA:  History of dementia, now with right elbow pain and edema. EXAM: RIGHT ELBOW - COMPLETE 3+ VIEW COMPARISON:  Right humerus and forearm radiographs-earlier same day FINDINGS: There is an acute displaced avulsion fracture of the olecranon process with associated foreshortening and adjacent soft tissue swelling. No additional fractures identified given obliquity. No radiopaque foreign body. IMPRESSION: 1. Acute minimally displaced avulsion fracture of the olecranon process with associated foreshortening. 2. No additional fractures identified. Electronically Signed   By: Simonne Come M.D.   On: 07/18/2018 16:28   Dg Forearm Right  Result Date: 07/18/2018 CLINICAL DATA:  History of dementia, now with right upper extremity edema. EXAM: RIGHT FOREARM - 2 VIEW COMPARISON:  None. FINDINGS: There is an acute minimally displaced avulsion fracture involving the olecranon process with associated foreshortening and adjacent soft tissue  swelling. No radiopaque foreign body. No additional fracture identified within the mid and distal aspects of the radius or ulna. Mild degenerative change involving the STT joints of the base of the thumb, incompletely evaluated. IMPRESSION: Acute, minimally displaced avulsion fracture of the olecranon process. Further evaluation with dedicated elbow radiographs could be performed as indicated. Electronically Signed   By: Simonne Come M.D.   On: 07/18/2018 15:41   Ct Head Wo Contrast  Result Date: 07/18/2018 CLINICAL DATA:  Trauma EXAM: CT HEAD WITHOUT CONTRAST CT CERVICAL SPINE WITHOUT CONTRAST TECHNIQUE: Multidetector CT imaging of the head and cervical spine was performed following the standard protocol without intravenous contrast. Multiplanar CT image reconstructions of the cervical spine were also generated. COMPARISON:  None. FINDINGS: CT HEAD FINDINGS Brain: No evidence of acute infarction, hemorrhage, hydrocephalus, extra-axial collection or mass lesion/mass effect. Periventricular white matter disease and global volume loss. Vascular: No hyperdense vessel or unexpected calcification. Skull: Normal. Negative for fracture or focal lesion. Sinuses/Orbits: No acute finding. Other: None. CT CERVICAL SPINE FINDINGS Alignment: Normal. Skull base and vertebrae: There is a subtle, likely acute superior endplate deformity of C7 without significant fracture fragment pulsion. No primary bone lesion or focal  pathologic process. Moderate to severe multilevel facet degenerative disease. Soft tissues and spinal canal: No prevertebral fluid or swelling. No visible canal hematoma. Disc levels: Moderate multilevel disc and facet degenerative disease, worst at C3 through C5. Upper chest: Negative. Other: None. IMPRESSION: 1. No acute intracranial pathology. Small-vessel white matter disease and volume loss in keeping with advanced patient age. 2. Subtle, likely acute superior endplate deformity of C7 without significant  fracture fragment pulsion. Correlate for referable pain. MRI may be used to further evaluate for marrow edema and fracture acuity if desired. Electronically Signed   By: Lauralyn Primes M.D.   On: 07/18/2018 16:24   Ct Cervical Spine Wo Contrast  Result Date: 07/18/2018 CLINICAL DATA:  Trauma EXAM: CT HEAD WITHOUT CONTRAST CT CERVICAL SPINE WITHOUT CONTRAST TECHNIQUE: Multidetector CT imaging of the head and cervical spine was performed following the standard protocol without intravenous contrast. Multiplanar CT image reconstructions of the cervical spine were also generated. COMPARISON:  None. FINDINGS: CT HEAD FINDINGS Brain: No evidence of acute infarction, hemorrhage, hydrocephalus, extra-axial collection or mass lesion/mass effect. Periventricular white matter disease and global volume loss. Vascular: No hyperdense vessel or unexpected calcification. Skull: Normal. Negative for fracture or focal lesion. Sinuses/Orbits: No acute finding. Other: None. CT CERVICAL SPINE FINDINGS Alignment: Normal. Skull base and vertebrae: There is a subtle, likely acute superior endplate deformity of C7 without significant fracture fragment pulsion. No primary bone lesion or focal pathologic process. Moderate to severe multilevel facet degenerative disease. Soft tissues and spinal canal: No prevertebral fluid or swelling. No visible canal hematoma. Disc levels: Moderate multilevel disc and facet degenerative disease, worst at C3 through C5. Upper chest: Negative. Other: None. IMPRESSION: 1. No acute intracranial pathology. Small-vessel white matter disease and volume loss in keeping with advanced patient age. 2. Subtle, likely acute superior endplate deformity of C7 without significant fracture fragment pulsion. Correlate for referable pain. MRI may be used to further evaluate for marrow edema and fracture acuity if desired. Electronically Signed   By: Lauralyn Primes M.D.   On: 07/18/2018 16:24   Dg Chest Portable 1  View  Result Date: 07/18/2018 CLINICAL DATA:  Weakness EXAM: PORTABLE CHEST 1 VIEW COMPARISON:  None. FINDINGS: Two lead left subclavian pacemaker is noted with lead tips overlying the right atrium and right ventricle. Mild cardiomegaly. Minimally tortuous atherosclerotic thoracic aorta. Otherwise normal mediastinal contour. No pneumothorax. No pleural effusion. Lungs appear clear, with no acute consolidative airspace disease and no pulmonary edema. IMPRESSION: Mild cardiomegaly without pulmonary edema. No active pulmonary disease. Electronically Signed   By: Delbert Phenix M.D.   On: 07/18/2018 20:04   Dg Humerus Right  Result Date: 07/18/2018 CLINICAL DATA:  History of dementia, now with deformity involving the right arm. EXAM: RIGHT HUMERUS - 2+ VIEW COMPARISON:  Right forearm radiographs-earlier same day FINDINGS: There is an acute minimally displaced avulsion fracture involving the olecranon process with associated foreshortening and adjacent soft tissue swelling. No radiopaque foreign body. No additional fracture identified within the proximal humerus. IMPRESSION: Acute, minimally displaced avulsion fracture of the olecranon process. Further evaluation with dedicated elbow radiographs could be performed as indicated. Electronically Signed   By: Simonne Come M.D.   On: 07/18/2018 15:40     Subjective: No chest pain or sob, irritated and wanted to get out of here.   Discharge Exam: Vitals:   07/21/18 2041 07/22/18 0457  BP: 136/71 (!) 143/85  Pulse: 81 72  Resp: 14 14  Temp: 98.7 F (37.1 C)  98 F (36.7 C)  SpO2: 95% 96%   Vitals:   07/21/18 0453 07/21/18 1317 07/21/18 2041 07/22/18 0457  BP: (!) 141/77 114/66 136/71 (!) 143/85  Pulse: 68 72 81 72  Resp: 15 16 14 14   Temp: 98.3 F (36.8 C) 97.9 F (36.6 C) 98.7 F (37.1 C) 98 F (36.7 C)  TempSrc: Oral Oral Oral Oral  SpO2: 96% 95% 95% 96%  Weight:      Height:        General: Pt is alert, awake, not in acute  distress Cardiovascular: RRR, S1/S2 +, no rubs, no gallops Respiratory: CTA bilaterally, no wheezing, no rhonchi Abdominal: Soft, NT, ND, bowel sounds + Extremities: right arm in splint.     The results of significant diagnostics from this hospitalization (including imaging, microbiology, ancillary and laboratory) are listed below for reference.     Microbiology: Recent Results (from the past 240 hour(s))  Culture, blood (Routine X 2) w Reflex to ID Panel     Status: None (Preliminary result)   Collection Time: 07/18/18  9:58 PM  Result Value Ref Range Status   Specimen Description   Final    BLOOD BLOOD RIGHT HAND Performed at [facility], [address] [place], [place] [address]    Special Requests   Final    BOTTLES DRAWN AEROBIC ONLY Blood Culture results may not be optimal due to an inadequate volume of blood received in culture bottles Performed at [facility], [address], [place], [place] [address]    Culture   Final    NO GROWTH 3 DAYS Performed at [facility], [address] [place], [place] [address]    Report Status PENDING  Incomplete  Culture, blood (Routine X 2) w Reflex to ID Panel     Status: None (Preliminary result)   Collection Time: 07/18/18  9:58 PM  Result Value Ref Range Status   Specimen Description   Final    BLOOD RIGHT ANTECUBITAL Performed at [facility], [address] [place], [place] [address]    Special Requests   Final    BOTTLES DRAWN AEROBIC ONLY Blood Culture adequate volume Performed at [facility], [address], [place], [place] [address]    Culture   Final    NO GROWTH 3 DAYS Performed at [facility], [address], [place], [place] [address]    Report Status PENDING  Incomplete     Labs: BNP (last 3 results) No results for input(s): BNP in the last 8760 hours. Basic Metabolic Panel: Recent Labs  Lab 07/18/18 1710  07/19/18 0348  NA 144 144  K 4.4 3.8  CL 108 111  CO2 27 25  GLUCOSE 112* 105*  BUN 21 22  CREATININE 1.12* 0.88  CALCIUM 9.5 8.6*   Liver Function Tests: No results for input(s): AST, ALT, ALKPHOS, BILITOT, PROT, ALBUMIN in the last 168 hours. No results for input(s): LIPASE, AMYLASE in the last 168 hours. No results for input(s): AMMONIA in the last 168 hours. CBC: Recent Labs  Lab 07/18/18 1710 07/19/18 0348  WBC 13.1* 9.9  HGB 13.6 11.8*  HCT 43.8 38.6  MCV 102.3* 105.2*  PLT 347 288   Cardiac Enzymes: No results for input(s): CKTOTAL, CKMB, CKMBINDEX, TROPONINI in the last 168 hours. BNP: Invalid input(s): POCBNP CBG: Recent Labs  Lab 07/20/18 0911 07/20/18 1145 07/21/18 0735 07/21/18 0807 07/22/18 0739  GLUCAP 100* 114* 64* 69*  68*   D-Dimer No results for input(s): DDIMER in the last 72 hours. Hgb A1c No results for input(s): HGBA1C in the last 72 hours. Lipid Profile No results for input(s): CHOL, HDL, LDLCALC, TRIG, CHOLHDL, LDLDIRECT in the last 72 hours. Thyroid function studies No results for input(s): TSH, T4TOTAL, T3FREE, THYROIDAB in the last 72 hours.  Invalid input(s): FREET3 Anemia work up No results for input(s): VITAMINB12, FOLATE, FERRITIN, TIBC, IRON, RETICCTPCT in the last 72 hours. Urinalysis    Component Value Date/Time   COLORURINE AMBER (A) 07/18/2018 1710   APPEARANCEUR CLOUDY (A) 07/18/2018 1710   LABSPEC 1.020 07/18/2018 1710   PHURINE 5.0 07/18/2018 1710   GLUCOSEU NEGATIVE 07/18/2018 1710   HGBUR NEGATIVE 07/18/2018 1710   BILIRUBINUR NEGATIVE 07/18/2018 1710   KETONESUR NEGATIVE 07/18/2018 1710   PROTEINUR 30 (A) 07/18/2018 1710   NITRITE POSITIVE (A) 07/18/2018 1710   LEUKOCYTESUR LARGE (A) 07/18/2018 1710   Sepsis Labs Invalid input(s): PROCALCITONIN,  WBC,  LACTICIDVEN Microbiology Recent Results (from the past 240 hour(s))  Culture, blood (Routine X 2) w Reflex to ID Panel     Status: None (Preliminary result)    Collection Time: 07/18/18  9:58 PM  Result Value Ref Range Status   Specimen Description   Final    BLOOD BLOOD RIGHT HAND Performed at [facility], [address] [place], [place] [address]    Special Requests   Final    BOTTLES DRAWN AEROBIC ONLY Blood Culture results may not be optimal due to an inadequate volume of blood received in culture bottles Performed at [facility], [address], [place], [place] [address]    Culture   Final    NO GROWTH 3 DAYS Performed at [facility], [address], [place], [place] [address]    Report Status PENDING  Incomplete  Culture, blood (Routine X 2) w Reflex to ID Panel     Status: None (Preliminary result)   Collection Time: 07/18/18  9:58 PM  Result Value Ref Range Status   Specimen Description   Final    BLOOD RIGHT ANTECUBITAL Performed at [facility], [address] [place], [place] [address]    Special Requests   Final    BOTTLES DRAWN AEROBIC ONLY Blood Culture adequate volume Performed at [facility], [address] [place], [place] [address]    Culture   Final    NO GROWTH 3 DAYS Performed at [facility], [address] [place], [place] [address]    Report Status PENDING  Incomplete     Time coordinating discharge: 32  minutes  SIGNED:   Kathlen Mody, MD  [facility] 07/22/2018, 10:13 AM Pager   If 7PM-7AM, please contact night-coverage [email or web address] Password [email or web address]

## 2018-07-24 LAB — CULTURE, BLOOD (ROUTINE X 2)
Culture: NO GROWTH
Culture: NO GROWTH
Special Requests: ADEQUATE

## 2018-08-16 ENCOUNTER — Telehealth (INDEPENDENT_AMBULATORY_CARE_PROVIDER_SITE_OTHER): Payer: Self-pay | Admitting: Radiology

## 2018-08-16 NOTE — Telephone Encounter (Signed)
Verbal order given  

## 2018-08-16 NOTE — Telephone Encounter (Signed)
Please see below and advise. I did see where Dr. [name] consulted on patient in the hospital, but she has not been seen in the office.   CB for [employer]  [phone]

## 2018-08-16 NOTE — Telephone Encounter (Signed)
That will be fine for [place] to sign off on any home health orders.  I did see her the one time in the hospital for her olecranon fracture and she was having significant falls.  What ever they would like [place] to do I am fine with.

## 2018-08-16 NOTE — Telephone Encounter (Signed)
Casey Lam with [facility] left voicemail on triage line asking if Dr. [name] would be the supervising physician to sign off on HHPT orders for the patient.

## 2018-08-25 ENCOUNTER — Telehealth (INDEPENDENT_AMBULATORY_CARE_PROVIDER_SITE_OTHER): Payer: Self-pay

## 2018-08-25 NOTE — Telephone Encounter (Signed)
Pt is in an assisted living facility and they are not letting outside PT/OT in their facility at this time. [facility] is asking for a hold order be faxed to them @ [phone]

## 2018-08-25 NOTE — Telephone Encounter (Signed)
[facility] patient 

## 2018-08-25 NOTE — Telephone Encounter (Signed)
CB pt thanks

## 2018-08-25 NOTE — Telephone Encounter (Signed)
Ok for hold order?

## 2018-08-26 NOTE — Telephone Encounter (Signed)
Faxed to provided number  

## 2018-09-06 ENCOUNTER — Ambulatory Visit (INDEPENDENT_AMBULATORY_CARE_PROVIDER_SITE_OTHER): Payer: Medicare Other

## 2018-09-06 ENCOUNTER — Other Ambulatory Visit: Payer: Self-pay

## 2018-09-06 ENCOUNTER — Telehealth (INDEPENDENT_AMBULATORY_CARE_PROVIDER_SITE_OTHER): Payer: Self-pay

## 2018-09-06 ENCOUNTER — Encounter (INDEPENDENT_AMBULATORY_CARE_PROVIDER_SITE_OTHER): Payer: Self-pay | Admitting: Orthopaedic Surgery

## 2018-09-06 ENCOUNTER — Ambulatory Visit (INDEPENDENT_AMBULATORY_CARE_PROVIDER_SITE_OTHER): Payer: Medicare Other | Admitting: Orthopaedic Surgery

## 2018-09-06 DIAGNOSIS — S52021D Displaced fracture of olecranon process without intraarticular extension of right ulna, subsequent encounter for closed fracture with routine healing: Secondary | ICD-10-CM

## 2018-09-06 NOTE — Progress Notes (Signed)
Office Visit Note   Patient: Casey Lam           Date of Birth: 1926/05/02           MRN: [id number] Visit Date: 09/06/2018              Requested by: [name], MD [address] [place], [place] [address] PCP: [name], MD   Assessment & Plan: Visit Diagnoses:  1. Closed fracture of olecranon process of right ulna with routine healing, subsequent encounter     Plan: We need to treat her closely from a wound care standpoint not given a prescription for her daughter to take to the skilled nursing facility for a wound care nurse consult.  Apparently wound care can be taken care of and addressed in this facility.  I placed Xeroform over the wound as well as a padded dressing.  I have also given a prescription for instructions to the facility to keep pressure off of her elbow and even have the facilities MD or PA take a look at her weekly as well.  Like to see her closely and follow-up in 2 weeks from now to continue assess her wound.  I have given her daughter reassurance that I have seen this kind of wound even with I have done surgery on patients and this is a difficult area to get to heal and you often see breakdown in this population.  We will see what she looks like in 2 weeks.  X-rays were not needed.  All questions and concerns were answered and addressed.  Follow-Up Instructions: Return in about 2 weeks (around 09/20/2018).   Orders:  Orders Placed This Encounter  Procedures  . XR Elbow 2 Views Right   No orders of the defined types were placed in this encounter.     Procedures: No procedures performed   Clinical Data: No additional findings.   Subjective: Chief Complaint  Patient presents with  . Right Arm - Pain  The patient is a 85 year old nursing home resident who is in the memory care unit.  I was consulted on her when she was in the hospital after mechanical fall 7 weeks ago.  She had sustained a displaced olecranon fracture of her right  elbow.  We recommended nonoperative treatment with a period of splinting and hopefully close follow-up.  The reason behind recommending no surgery was given the patient's dementia and given the fact that surgery in this area of the elbow is right with complications with hardware failure in a demented person as well as soft tissue issues.  She is someone that is not high functioning and does have significant dementia issues so we felt it was in the best interest to avoid surgery all along.  Unfortunately in light of the coronavirus pandemic she has been lost to follow-up and is finding seen in the office today with her daughter present.  The splint has been on the whole time.  HPI  Review of Systems There currently appear to be no active medical issues in terms of fever, chills, nausea, vomiting  Objective: Vital Signs: There were no vitals taken for this visit.  Physical Exam She is alert but not oriented.  Her daughter is with her at the bedside.  She appears in no acute distress Ortho Exam Examination of her right elbow out of the splint shows an open wound just distal to the tip of the olecranon from likely pressure of the splint as well as  pressure on the elbow.  There is certainly breakdown that is full-thickness.  There is no evidence of infection.  I was able to clean the area well. Specialty Comments:  No specialty comments available.  Imaging: Xr Elbow 2 Views Right  Result Date: 09/06/2018 2 views of the right elbow show a displaced olecranon fracture.  The elbow is otherwise well located.    PMFS History: Patient Active Problem List   Diagnosis Date Noted  . Dementia (HCC) 07/18/2018  . Hypertension 07/18/2018  . UTI (urinary tract infection) 07/18/2018  . Acute renal failure superimposed on stage 2 chronic kidney disease (HCC) 07/18/2018  . Fracture of olecranon process of right ulna 07/18/2018  . Fall 07/18/2018   Past Medical History:  Diagnosis Date  . Dementia  (HCC) 07/18/2018  . Hearing loss 07/18/2018  . Hypertension 07/18/2018    Family History  Problem Relation Age of Onset  . Dementia Sister   . Dementia Brother     Past Surgical History:  Procedure Laterality Date  . PACEMAKER PLACEMENT     Social History   Occupational History  . Not on file  Tobacco Use  . Smoking status: Never Smoker  . Smokeless tobacco: Never Used  Substance and Sexual Activity  . Alcohol use: Not Currently  . Drug use: Never  . Sexual activity: Not Currently

## 2018-09-06 NOTE — Telephone Encounter (Signed)
Holly with [facility] called and needed to verify wound orders for patient. Please call her to advise [phone]

## 2018-09-07 NOTE — Telephone Encounter (Signed)
Holly aware of wound orders

## 2018-09-07 NOTE — Telephone Encounter (Signed)
The patient needs a wound care consult that can be done at their facility according to the daughter of the patient.  This can be done through [facility] or 1 of the wound nurses that it is allowed over there.  In the interim, I would like them to place a small piece of Xeroform over her elbow wound daily along with dry dressing.

## 2018-09-07 NOTE — Telephone Encounter (Signed)
Please see below, I guess they are questioning exactly what to do?

## 2018-09-20 ENCOUNTER — Ambulatory Visit: Payer: Self-pay | Admitting: Orthopaedic Surgery

## 2018-10-06 ENCOUNTER — Telehealth: Payer: Self-pay | Admitting: Orthopaedic Surgery

## 2018-10-06 NOTE — Telephone Encounter (Signed)
Faxed to provided number  

## 2018-10-06 NOTE — Telephone Encounter (Signed)
Ann called with [facility] called asking for more home health visits with pt.  Anns # Is [phone] Fax # [phone]

## 2018-10-22 ENCOUNTER — Telehealth: Payer: Self-pay

## 2018-10-22 NOTE — Telephone Encounter (Signed)
Leshea at [facility] would like to know the dosage and directions for the Rx Silver Nitrate? Stated that Rx said Silver Nitrate for wound only.  Cb# is [phone].  Please advise.  Thank you.

## 2018-10-25 NOTE — Telephone Encounter (Signed)
They called and asked Casey Lam if they could put that on her would and he said yes

## 2018-10-25 NOTE — Telephone Encounter (Signed)
I'm not sure how to answer that?

## 2018-10-25 NOTE — Telephone Encounter (Signed)
Who gave rx for AgNi?

## 2018-10-25 NOTE — Telephone Encounter (Signed)
We spoke about this at length silver nitrate has no benefit for a wound unless hyper epithelial overgrowth

## 2018-10-26 NOTE — Telephone Encounter (Signed)
Aware that their pharmacy doesn't need to provide this, this was something the home health nurse was asking verbal for

## 2019-07-03 ENCOUNTER — Encounter (HOSPITAL_COMMUNITY): Payer: Self-pay

## 2019-07-03 ENCOUNTER — Emergency Department (HOSPITAL_COMMUNITY)
Admission: EM | Admit: 2019-07-03 | Discharge: 2019-07-04 | Disposition: A | Discharge status: Home / Self Care | Payer: Medicare Other | Attending: Emergency Medicine | Admitting: Emergency Medicine

## 2019-07-03 ENCOUNTER — Other Ambulatory Visit: Payer: Self-pay

## 2019-07-03 ENCOUNTER — Emergency Department (HOSPITAL_COMMUNITY): Payer: Medicare Other

## 2019-07-03 DIAGNOSIS — Z950 Presence of cardiac pacemaker: Secondary | ICD-10-CM | POA: Diagnosis not present

## 2019-07-03 DIAGNOSIS — Y939 Activity, unspecified: Secondary | ICD-10-CM | POA: Insufficient documentation

## 2019-07-03 DIAGNOSIS — S51011A Laceration without foreign body of right elbow, initial encounter: Secondary | ICD-10-CM

## 2019-07-03 DIAGNOSIS — F0390 Unspecified dementia without behavioral disturbance: Secondary | ICD-10-CM | POA: Insufficient documentation

## 2019-07-03 DIAGNOSIS — W1830XA Fall on same level, unspecified, initial encounter: Secondary | ICD-10-CM | POA: Diagnosis not present

## 2019-07-03 DIAGNOSIS — Z79899 Other long term (current) drug therapy: Secondary | ICD-10-CM | POA: Diagnosis not present

## 2019-07-03 DIAGNOSIS — I10 Essential (primary) hypertension: Secondary | ICD-10-CM | POA: Diagnosis not present

## 2019-07-03 DIAGNOSIS — Y92129 Unspecified place in nursing home as the place of occurrence of the external cause: Secondary | ICD-10-CM | POA: Diagnosis not present

## 2019-07-03 DIAGNOSIS — Y999 Unspecified external cause status: Secondary | ICD-10-CM | POA: Diagnosis not present

## 2019-07-03 MED ORDER — LIDOCAINE-EPINEPHRINE 2 %-1:100000 IJ SOLN
20.0000 mL | Freq: Once | INTRAMUSCULAR | Status: AC
  Administered 2019-07-03: 20 mL
  Filled 2019-07-03: qty 1

## 2019-07-03 MED ORDER — LIDOCAINE-EPINEPHRINE (PF) 2 %-1:200000 IJ SOLN: 20.0000 mL | Freq: Once | INTRAMUSCULAR | Status: DC

## 2019-07-03 NOTE — ED Triage Notes (Signed)
Pt BIB [facility] from [facility] with a laceration to the left elbow. Unknown cause, pt has hx of dementia and is at baseline.

## 2019-07-03 NOTE — ED Notes (Signed)
Bandaged pt's elbow to prevent further opening of wound until laceration is sutured.

## 2019-07-03 NOTE — ED Notes (Signed)
Suture cart placed outside of room  

## 2019-07-03 NOTE — ED Notes (Signed)
Sophia, PA and PA student at bedside.

## 2019-07-03 NOTE — ED Provider Notes (Signed)
[facility]-EMERGENCY DEPT Provider Note   CSN: [id number] Arrival date & time: 07/03/19  2240     History Chief Complaint  Patient presents with  . Extremity Laceration    Casey Lam is a 86 y.o. female presenting for evaluation of right elbow laceration.  Level 5 caveat due to dementia.  History obtained from EMS.  EMS states patient lives at a facility.  Day team at the facility reported that there was a skin tag on patient's elbow that had "busted open."  When night team evaluated the patient, she had a large laceration over her right elbow.  No other signs of injury.  Unknown time of day when this occurred.  Unknown location of when this occurred.  Patient was not witnessed falling.  She is not on blood thinners.  HPI     Past Medical History:  Diagnosis Date  . Dementia (HCC) 07/18/2018  . Hearing loss 07/18/2018  . Hypertension 07/18/2018    Patient Active Problem List   Diagnosis Date Noted  . Dementia (HCC) 07/18/2018  . Hypertension 07/18/2018  . UTI (urinary tract infection) 07/18/2018  . Acute renal failure superimposed on stage 2 chronic kidney disease (HCC) 07/18/2018  . Fracture of olecranon process of right ulna 07/18/2018  . Fall 07/18/2018    Past Surgical History:  Procedure Laterality Date  . PACEMAKER PLACEMENT       OB History   No obstetric history on file.     Family History  Problem Relation Age of Onset  . Dementia Sister   . Dementia Brother     Social History   Tobacco Use  . Smoking status: Never Smoker  . Smokeless tobacco: Never Used  Substance Use Topics  . Alcohol use: Not Currently  . Drug use: Never    Home Medications Prior to Admission medications   Medication Sig Start Date End Date Taking? Authorizing Provider  acetaminophen (TYLENOL) 325 MG tablet Take 2 tablets (650 mg total) by mouth every 6 (six) hours as needed for mild pain (or Fever >/= 101). 07/22/18   Kathlen Mody, MD   atenolol (TENORMIN) 25 MG tablet Take 25 mg by mouth daily. 05/17/18   [provider]  cephALEXin (KEFLEX) 250 MG capsule Take 1 capsule (250 mg total) by mouth 2 (two) times daily for 5 days. 07/04/19 07/09/19  Kemara Quigley, PA-C  doxylamine, Sleep, (UNISOM) 25 MG tablet Take 1 tablet by mouth at bedtime.    [provider]  Ensure Max Protein (ENSURE MAX PROTEIN) LIQD Take 330 mLs (11 oz total) by mouth daily. 07/22/18   Kathlen Mody, MD  feeding supplement, ENSURE ENLIVE, (ENSURE ENLIVE) LIQD Take 237 mLs by mouth 2 (two) times daily between meals. 07/22/18   Kathlen Mody, MD  methocarbamol (ROBAXIN) 500 MG tablet Take 1 tablet (500 mg total) by mouth every 8 (eight) hours as needed for muscle spasms. 07/22/18   Kathlen Mody, MD  PARoxetine (PAXIL) 30 MG tablet Take 30 mg by mouth daily. 05/17/18   [provider]  senna-docusate (SENOKOT-S) 8.6-50 MG tablet Take 1 tablet by mouth at bedtime as needed for mild constipation. 07/22/18   Kathlen Mody, MD    Allergies    Lorazepam  Review of Systems   Review of Systems  Unable to perform ROS: Dementia  Skin: Positive for wound.    Physical Exam Updated Vital Signs BP (!) 156/92   Pulse 80   Temp 98.5 F (36.9 C) (Oral)  Resp 18   SpO2 98%   Physical Exam Vitals and nursing note reviewed.  Constitutional:      General: She is not in acute distress.    Appearance: She is well-developed.     Comments: Elderly female resting comfortably in the bed in no acute distress  HENT:     Head: Normocephalic and atraumatic.  Eyes:     Extraocular Movements: Extraocular movements intact.     Conjunctiva/sclera: Conjunctivae normal.     Pupils: Pupils are equal, round, and reactive to light.  Cardiovascular:     Rate and Rhythm: Normal rate and regular rhythm.     Pulses: Normal pulses.  Pulmonary:     Effort: Pulmonary effort is normal. No respiratory distress.     Breath sounds: Normal breath sounds. No  wheezing.  Abdominal:     General: There is no distension.     Palpations: Abdomen is soft. There is no mass.     Tenderness: There is no abdominal tenderness. There is no guarding or rebound.  Musculoskeletal:        General: Normal range of motion.     Cervical back: Normal range of motion and neck supple.     Comments: Large, approximately 4 cm laceration of the right elbow over the olecranon.  On visual examination, this does not appear to extend into the joint.  Patient without signs of pain with movement of the elbow.  No active bleeding at this time.  Skin:    General: Skin is warm and dry.  Neurological:     Mental Status: She is alert.        ED Results / Procedures / Treatments   Labs (all labs ordered are listed, but only abnormal results are displayed) Labs Reviewed - No data to display  EKG None  Radiology DG Elbow Complete Right  Result Date: 07/03/2019 CLINICAL DATA:  Laceration, limited range of motion EXAM: RIGHT ELBOW - COMPLETE 3+ VIEW COMPARISON:  09/06/2018 FINDINGS: Frontal, bilateral oblique, lateral views of the right elbow are obtained. The distracted olecranon fracture seen previously is unchanged in position. No evidence of dislocation. No new bony abnormalities. Joint spaces are relatively well preserved. Mild dorsal soft tissue swelling over the olecranon. IMPRESSION: 1. Stable distracted olecranon fracture. 2. Soft tissue edema. 3. No new bony abnormality. Electronically Signed   By: Randa Ngo M.D.   On: 07/03/2019 23:28    Procedures .Marland KitchenLaceration Repair  Date/Time: 07/04/2019 12:22 AM Performed by: Franchot Heidelberg, PA-C Authorized by: Franchot Heidelberg, PA-C   Consent:    Consent obtained:  Verbal Anesthesia (see MAR for exact dosages):    Anesthesia method:  Local infiltration   Local anesthetic:  Lidocaine 2% WITH epi Laceration details:    Location:  Shoulder/arm   Shoulder/arm location:  R elbow   Length (cm):  4   Depth (mm):   3 Repair type:    Repair type:  Simple Pre-procedure details:    Preparation:  Patient was prepped and draped in usual sterile fashion and imaging obtained to evaluate for foreign bodies Exploration:    Wound exploration: wound explored through full range of motion and entire depth of wound probed and visualized     Wound extent: no foreign bodies/material noted, no tendon damage noted and no vascular damage noted   Treatment:    Area cleansed with:  Saline   Amount of cleaning:  Extensive   Irrigation solution:  Sterile saline   Irrigation method:  Syringe  Skin repair:    Repair method:  Sutures   Suture size:  3-0   Suture material:  Prolene   Suture technique:  Simple interrupted   Number of sutures:  3 Approximation:    Approximation:  Close Post-procedure details:    Dressing:  Sterile dressing   Patient tolerance of procedure:  Tolerated with difficulty   (including critical care time)  Medications Ordered in ED Medications  lidocaine-EPINEPHrine (XYLOCAINE W/EPI) 2 %-1:100000 (with pres) injection 20 mL (20 mLs Infiltration Given 07/03/19 2347)    ED Course  I have reviewed the triage vital signs and the nursing notes.  Pertinent labs & imaging results that were available during my care of the patient were reviewed by me and considered in my medical decision making (see chart for details).    MDM Rules/Calculators/A&P                      Patient presenting for evaluation of elbow laceration.  Unknown cause of laceration.  No injury elsewhere noted on exam.  Will obtain x-ray to ensure no underlying fracture.  X-ray viewed interpreted by me, does not show any acute fracture.  Per radiology, there is a stable chronic distraction fracture which is unchanged from previous.  Case discussed with attending, Dr. Matt Holmes evaluated the patient.  Laceration irrigation and repair performed as described above.  Patient tolerated with difficulty due to her dementia.  Limited  sutures were placed due to patient's difficulty staying still and cooperation.  As it is an unknown time as to when incident occurred, and unknown cause of patient's laceration, will place on antibiotics prophylactically.  At this time, patient appears safe for discharge.  Final Clinical Impression(s) / ED Diagnoses Final diagnoses:  Elbow laceration, right, initial encounter    Rx / DC Orders ED Discharge Orders         Ordered    cephALEXin (KEFLEX) 250 MG capsule  2 times daily     07/04/19 0011           Alveria Apley, PA-C 07/04/19 0025    Sabas Sous, MD 07/04/19 [phone]

## 2019-07-04 MED ORDER — CEPHALEXIN 250 MG PO CAPS: 250.0000 mg | ORAL_CAPSULE | Freq: Two times a day (BID) | ORAL | 0 refills | Status: AC

## 2019-07-04 NOTE — ED Notes (Addendum)
Leshea at [facility] notified of pt return, discharge instructions reviewed.   [facility] called for transport.

## 2019-07-04 NOTE — Discharge Instructions (Addendum)
1. Medications: Tylenol or ibuprofen for pain, usual home medications. Take the entire course of antibiotics.  2. Treatment: ice for swelling, keep wound clean with warm soap and water and keep bandage dry, do not submerge in water for 24 hours 3. Follow Up: Please return in 10 days to have your stitches removed or sooner if you have concerns. Return to the emergency department for increased redness, drainage of pus from the wound   WOUND CARE  Keep area clean and dry for 24 hours. Do not remove bandage, if applied.  After 24 hours, remove bandage and wash wound gently with mild soap and warm water. Reapply a new bandage after cleaning wound, if directed.   Continue daily cleansing with soap and water until stitches/staples are removed.  Do not apply any ointments or creams to the wound while stitches/staples are in place, as this may cause delayed healing. Return if you experience any of the following signs of infection: Swelling, redness, pus drainage, streaking, fever >101.0 F  Return if you experience excessive bleeding that does not stop after 15-20 minutes of constant, firm pressure.

## 2019-08-13 ENCOUNTER — Other Ambulatory Visit: Payer: Self-pay

## 2019-08-13 ENCOUNTER — Encounter (HOSPITAL_COMMUNITY): Payer: Self-pay | Admitting: Emergency Medicine

## 2019-08-13 ENCOUNTER — Emergency Department (HOSPITAL_COMMUNITY): Payer: Medicare Other

## 2019-08-13 ENCOUNTER — Emergency Department (HOSPITAL_COMMUNITY)
Admission: EM | Admit: 2019-08-13 | Discharge: 2019-08-13 | Disposition: A | Discharge status: Skilled Nursing Facility | Payer: Medicare Other | Attending: Emergency Medicine | Admitting: Emergency Medicine

## 2019-08-13 DIAGNOSIS — Z79899 Other long term (current) drug therapy: Secondary | ICD-10-CM | POA: Insufficient documentation

## 2019-08-13 DIAGNOSIS — N182 Chronic kidney disease, stage 2 (mild): Secondary | ICD-10-CM | POA: Insufficient documentation

## 2019-08-13 DIAGNOSIS — I129 Hypertensive chronic kidney disease with stage 1 through stage 4 chronic kidney disease, or unspecified chronic kidney disease: Secondary | ICD-10-CM | POA: Insufficient documentation

## 2019-08-13 DIAGNOSIS — F0390 Unspecified dementia without behavioral disturbance: Secondary | ICD-10-CM | POA: Diagnosis not present

## 2019-08-13 DIAGNOSIS — K5900 Constipation, unspecified: Secondary | ICD-10-CM

## 2019-08-13 DIAGNOSIS — N3000 Acute cystitis without hematuria: Secondary | ICD-10-CM | POA: Diagnosis not present

## 2019-08-13 LAB — URINALYSIS, ROUTINE W REFLEX MICROSCOPIC
Bilirubin Urine: NEGATIVE
Glucose, UA: NEGATIVE mg/dL
Hgb urine dipstick: NEGATIVE
Ketones, ur: NEGATIVE mg/dL
Nitrite: POSITIVE — AB
Protein, ur: NEGATIVE mg/dL
Specific Gravity, Urine: 1.02 (ref 1.005–1.030)
WBC, UA: >50 WBC/hpf — ABNORMAL HIGH (ref 0–5)
pH: 6 (ref 5.0–8.0)

## 2019-08-13 LAB — CBC WITH DIFFERENTIAL/PLATELET
Abs Immature Granulocytes: 0.01 10*3/uL (ref 0.00–0.07)
Basophils Absolute: 0.1 10*3/uL (ref 0.0–0.1)
Basophils Relative: 1 %
Eosinophils Absolute: 0.7 10*3/uL — ABNORMAL HIGH (ref 0.0–0.5)
Eosinophils Relative: 8 %
HCT: 39.3 % (ref 36.0–46.0)
Hemoglobin: 12.2 g/dL (ref 12.0–15.0)
Immature Granulocytes: 0 %
Lymphocytes Relative: 24 %
Lymphs Abs: 2.1 10*3/uL (ref 0.7–4.0)
MCH: 29.9 pg (ref 26.0–34.0)
MCHC: 31 g/dL (ref 30.0–36.0)
MCV: 96.3 fL (ref 80.0–100.0)
Monocytes Absolute: 0.7 10*3/uL (ref 0.1–1.0)
Monocytes Relative: 8 %
Neutro Abs: 5.2 10*3/uL (ref 1.7–7.7)
Neutrophils Relative %: 59 %
Platelets: 272 10*3/uL (ref 150–400)
RBC: 4.08 MIL/uL (ref 3.87–5.11)
RDW: 14.4 % (ref 11.5–15.5)
WBC: 8.9 10*3/uL (ref 4.0–10.5)
nRBC: 0 % (ref 0.0–0.2)

## 2019-08-13 LAB — COMPREHENSIVE METABOLIC PANEL
ALT: 14 U/L (ref 0–44)
AST: 23 U/L (ref 15–41)
Albumin: 3.2 g/dL — ABNORMAL LOW (ref 3.5–5.0)
Alkaline Phosphatase: 72 U/L (ref 38–126)
Anion gap: 8 (ref 5–15)
BUN: 25 mg/dL — ABNORMAL HIGH (ref 8–23)
CO2: 29 mmol/L (ref 22–32)
Calcium: 8.9 mg/dL (ref 8.9–10.3)
Chloride: 110 mmol/L (ref 98–111)
Creatinine, Ser: 0.81 mg/dL (ref 0.44–1.00)
GFR calc Af Amer: >60 mL/min (ref >60)
GFR calc non Af Amer: >60 mL/min (ref >60)
Glucose, Bld: 89 mg/dL (ref 70–99)
Potassium: 4.1 mmol/L (ref 3.5–5.1)
Sodium: 147 mmol/L — ABNORMAL HIGH (ref 135–145)
Total Bilirubin: 0.4 mg/dL (ref 0.3–1.2)
Total Protein: 6.6 g/dL (ref 6.5–8.1)

## 2019-08-13 LAB — POC OCCULT BLOOD, ED: Fecal Occult Bld: NEGATIVE

## 2019-08-13 MED ORDER — CEPHALEXIN 500 MG PO CAPS: 500.0000 mg | ORAL_CAPSULE | Freq: Three times a day (TID) | ORAL | 0 refills | Status: AC

## 2019-08-13 MED ORDER — MAGNESIUM CITRATE PO SOLN: 1.0000 | Freq: Once | ORAL | 1 refills | Status: AC

## 2019-08-13 MED ORDER — MAGNESIUM CITRATE PO SOLN: 1.0000 | Freq: Once | ORAL | 1 refills | Status: DC

## 2019-08-13 MED ORDER — CEPHALEXIN 500 MG PO CAPS: 500.0000 mg | ORAL_CAPSULE | Freq: Three times a day (TID) | ORAL | 0 refills | Status: DC

## 2019-08-13 MED ORDER — DOCUSATE SODIUM 100 MG PO CAPS: 100.0000 mg | ORAL_CAPSULE | Freq: Two times a day (BID) | ORAL | 0 refills | Status: DC

## 2019-08-13 MED ORDER — FLEET ENEMA 7-19 GM/118ML RE ENEM
1.0000 | ENEMA | Freq: Once | RECTAL | Status: AC
  Administered 2019-08-13: 1 via RECTAL
  Filled 2019-08-13: qty 1

## 2019-08-13 MED ORDER — FLEET ENEMA 7-19 GM/118ML RE ENEM: 1.0000 | ENEMA | Freq: Once | RECTAL | Status: DC

## 2019-08-13 NOTE — Discharge Instructions (Signed)
Please take the medications, as prescribed.  She would benefit from having as needed constipation medication available to her for future episodes of straining with defecation.    Her UA demonstrated a clear urinary tract infection for which we will culture and treat with Keflex.  The remainder of her laboratory work-up was reassuring.  Plain films did not demonstrate any obstruction or perforation.  I recommend increased oral hydration and high-fiber diet.  Please have patient return to the ED or seek immediate medical attention should she experience any new or worsening symptoms.

## 2019-08-13 NOTE — ED Provider Notes (Signed)
[facility]-EMERGENCY DEPT Provider Note   CSN: [id number] Arrival date & time: 08/13/19  1441     History Chief Complaint  Patient presents with  . Constipation    Casey Lam is a 86 y.o. female with PMH significant for dementia, hearing loss, and HTN who presents to the ED via EMS from her assisted living facility for constipation.  Obtained history from EMS reports that patient grimaced with abdominal palpation.  I called [facility], the assisted living facility where she resides, and spoke with RNs, Diane.  She reports that patient has a 48-hour history of constipation.  While she does not typically go daily, the nursing aides noticed that she was straining on the toilet this morning unable to defecate.  They suspect fecal impaction.  They are on-site provider only works on Wednesday and there are no as needed orders for constipation for this particular patient.  She has not received any enemas, MiraLAX, Dulcolax, or other intervention that may help facilitate passage of stool.  On my initial exam, patient is unable to answer any questions.  Diane reports that this is her baseline dementia and she has not noted any change in mentation or behavior.  She also denies any fevers, nausea or vomiting, diminished appetite, or other abnormal or unusual symptoms for this patient.  Patient is a level 5 caveat due to her dementia.  HPI     Past Medical History:  Diagnosis Date  . Dementia (HCC) 07/18/2018  . Hearing loss 07/18/2018  . Hypertension 07/18/2018    Patient Active Problem List   Diagnosis Date Noted  . Dementia (HCC) 07/18/2018  . Hypertension 07/18/2018  . UTI (urinary tract infection) 07/18/2018  . Acute renal failure superimposed on stage 2 chronic kidney disease (HCC) 07/18/2018  . Fracture of olecranon process of right ulna 07/18/2018  . Fall 07/18/2018    Past Surgical History:  Procedure Laterality Date  . PACEMAKER PLACEMENT       OB  History   No obstetric history on file.     Family History  Problem Relation Age of Onset  . Dementia Sister   . Dementia Brother     Social History   Tobacco Use  . Smoking status: Never Smoker  . Smokeless tobacco: Never Used  Substance Use Topics  . Alcohol use: Not Currently  . Drug use: Never    Home Medications Prior to Admission medications   Medication Sig Start Date End Date Taking? Authorizing Provider  acetaminophen (TYLENOL) 325 MG tablet Take 2 tablets (650 mg total) by mouth every 6 (six) hours as needed for mild pain (or Fever >/= 101). 07/22/18   Kathlen Mody, MD  atenolol (TENORMIN) 25 MG tablet Take 25 mg by mouth daily. 05/17/18   [provider]  busPIRone (BUSPAR) 10 MG tablet Take 10 mg by mouth daily. 07/07/19   [provider]  cephALEXin (KEFLEX) 500 MG capsule Take 1 capsule (500 mg total) by mouth 3 (three) times daily for 7 days. 08/13/19 08/20/19  Lorelee New, PA-C  docusate sodium (COLACE) 100 MG capsule Take 1 capsule (100 mg total) by mouth every 12 (twelve) hours. 08/13/19   Lorelee New, PA-C  doxylamine, Sleep, (UNISOM) 25 MG tablet Take 1 tablet by mouth at bedtime.    [provider]  Ensure Max Protein (ENSURE MAX PROTEIN) LIQD Take 330 mLs (11 oz total) by mouth daily. 07/22/18   Kathlen Mody, MD  feeding supplement, ENSURE ENLIVE, (ENSURE  ENLIVE) LIQD Take 237 mLs by mouth 2 (two) times daily between meals. 07/22/18   Hosie Poisson, MD  magnesium citrate SOLN Take 296 mLs (1 Bottle total) by mouth once for 1 dose. 08/13/19 08/13/19  Corena Herter, PA-C  methocarbamol (ROBAXIN) 500 MG tablet Take 1 tablet (500 mg total) by mouth every 8 (eight) hours as needed for muscle spasms. 07/22/18   Hosie Poisson, MD  PARoxetine (PAXIL) 30 MG tablet Take 30 mg by mouth daily. 05/17/18   [provider]  senna-docusate (SENOKOT-S) 8.6-50 MG tablet Take 1 tablet by mouth at bedtime as needed for mild constipation. 07/22/18    Hosie Poisson, MD    Allergies    Lorazepam  Review of Systems   Review of Systems  Unable to perform ROS: Dementia    Physical Exam Updated Vital Signs BP (!) 177/104   Pulse 83   Temp 97.7 F (36.5 C) (Oral)   Resp (!) 22   SpO2 97%   Physical Exam Vitals and nursing note reviewed. Exam conducted with a chaperone present.  Constitutional:      General: She is not in acute distress.    Appearance: Normal appearance. She is not ill-appearing.  HENT:     Head: Normocephalic and atraumatic.  Eyes:     General: No scleral icterus.    Conjunctiva/sclera: Conjunctivae normal.  Cardiovascular:     Rate and Rhythm: Normal rate and regular rhythm.     Pulses: Normal pulses.     Heart sounds: Normal heart sounds.  Pulmonary:     Effort: Pulmonary effort is normal. No respiratory distress.     Breath sounds: Normal breath sounds.  Abdominal:     Comments: Soft, nondistended.  No grimacing or guarding noted on my abdominal examination.  No overlying skin changes.  Normoactive bowel sounds.  Genitourinary:    Comments: Hemorrhoids noted, do not appear thrombosed or irritated. No BRBPR. No gross melena. Digital rectal exam: Moderate amount of soft stool appreciated in rectal vault. Sphincter tone intact.   Musculoskeletal:     Cervical back: Normal range of motion.  Skin:    General: Skin is dry.     Capillary Refill: Capillary refill takes less than 2 seconds.  Neurological:     Mental Status: She is alert and oriented to person, place, and time.     GCS: GCS eye subscore is 4. GCS verbal subscore is 5. GCS motor subscore is 6.  Psychiatric:        Mood and Affect: Mood normal.        Behavior: Behavior normal.        Thought Content: Thought content normal.     ED Results / Procedures / Treatments   Labs (all labs ordered are listed, but only abnormal results are displayed) Labs Reviewed  CBC WITH DIFFERENTIAL/PLATELET - Abnormal; Notable for the following  components:      Result Value   Eosinophils Absolute 0.7 (*)    All other components within normal limits  COMPREHENSIVE METABOLIC PANEL - Abnormal; Notable for the following components:   Sodium 147 (*)    BUN 25 (*)    Albumin 3.2 (*)    All other components within normal limits  URINALYSIS, ROUTINE W REFLEX MICROSCOPIC - Abnormal; Notable for the following components:   APPearance HAZY (*)    Nitrite POSITIVE (*)    Leukocytes,Ua LARGE (*)    WBC, UA >50 (*)    Bacteria, UA MANY (*)  All other components within normal limits  URINE CULTURE  POC OCCULT BLOOD, ED    EKG None  Radiology DG ABD ACUTE 2+V W 1V CHEST  Result Date: 08/13/2019 CLINICAL DATA:  Abdominal pain, evaluate for constipation EXAM: DG ABDOMEN ACUTE W/ 1V CHEST COMPARISON:  Chest radiographs, 07/18/2018 FINDINGS: There is no evidence of dilated bowel loops or free intraperitoneal air. Moderate burden of stool throughout the colon. No radiopaque calculi or other significant radiographic abnormality is seen. Cardiomegaly with left chest multi lead pacer. Both lungs are clear. IMPRESSION: 1. Nonobstructive pattern of bowel gas. Moderate burden of stool throughout the colon. No free air in the abdomen. 2.  Cardiomegaly without acute abnormality of the lungs. Electronically Signed   By: Lauralyn Primes M.D.   On: 08/13/2019 17:21    Procedures Procedures (including critical care time)  Medications Ordered in ED Medications  sodium phosphate (FLEET) 7-19 GM/118ML enema 1 enema (1 enema Rectal Given 08/13/19 1550)    ED Course  I have reviewed the triage vital signs and the nursing notes.  Pertinent labs & imaging results that were available during my care of the patient were reviewed by me and considered in my medical decision making (see chart for details).    MDM Rules/Calculators/A&P                      Plain films obtained and personally reviewed of abdomen upright demonstrates a nonobstructive pattern of  bowel gas and moderate burden of stool throughout colon. No evidence of free air in the abdomen concerning for perforation. No obvious bowel wall thickening concerning for colitis.  Fecal occult was negative and there is no gross melena or BRBPR on exam.  CMP and CBC both reassuring.  I reviewed her UA however which demonstrates positive nitrite, large leuks, and many bacteria.  Will obtain urine culture and treat for UTI with Keflex.  No significant flank pain on examination.  No elevated WBC or fevers that would otherwise raise higher suspicion for pyelonephritis or infected kidney stone.  We will also prescribe patient MiraLAX and Dulcolax to take, as needed, for symptoms of constipation.  Will emphasize the importance of increased hydration and high-fiber diet.  Will explicitly state these plans in her discharge papers for the facility to review.  We will also place in their strict ED return precautions.  She was able to pass stool and flatulence here in the ED and there was no significant obstruction seen on plain films.  Abdominal exam is benign.  Do not feel as though CT imaging is warranted for further evaluation.  Discussed with Dr. Rhunette Croft who personally evaluated patient and agrees with assessment and plan.   Final Clinical Impression(s) / ED Diagnoses Final diagnoses:  Acute cystitis without hematuria  Constipation, unspecified constipation type    Rx / DC Orders ED Discharge Orders         Ordered    cephALEXin (KEFLEX) 500 MG capsule  3 times daily     08/13/19 1923    magnesium citrate SOLN   Once     08/13/19 1923    docusate sodium (COLACE) 100 MG capsule  Every 12 hours     08/13/19 1923           Lorelee New, PA-C 08/13/19 1926    Derwood Kaplan, MD 08/14/19 0040

## 2019-08-13 NOTE — ED Notes (Signed)
[facility], facility and family called.

## 2019-08-13 NOTE — ED Triage Notes (Signed)
Patient brought in [facility]  From [facility] with complaints of constipation x 1-3 days. Per EMS abdomen tender to touch.

## 2019-08-16 LAB — URINE CULTURE: Culture: >=100000 — AB

## 2019-08-19 ENCOUNTER — Other Ambulatory Visit: Payer: Self-pay

## 2019-08-19 ENCOUNTER — Emergency Department (HOSPITAL_BASED_OUTPATIENT_CLINIC_OR_DEPARTMENT_OTHER): Payer: Medicare Other

## 2019-08-19 ENCOUNTER — Encounter (HOSPITAL_BASED_OUTPATIENT_CLINIC_OR_DEPARTMENT_OTHER): Payer: Self-pay | Admitting: *Deleted

## 2019-08-19 ENCOUNTER — Emergency Department (HOSPITAL_BASED_OUTPATIENT_CLINIC_OR_DEPARTMENT_OTHER)
Admission: EM | Admit: 2019-08-19 | Discharge: 2019-08-19 | Disposition: A | Discharge status: Home / Self Care | Payer: Medicare Other | Attending: Emergency Medicine | Admitting: Emergency Medicine

## 2019-08-19 DIAGNOSIS — Z950 Presence of cardiac pacemaker: Secondary | ICD-10-CM | POA: Diagnosis not present

## 2019-08-19 DIAGNOSIS — N182 Chronic kidney disease, stage 2 (mild): Secondary | ICD-10-CM | POA: Diagnosis not present

## 2019-08-19 DIAGNOSIS — R0989 Other specified symptoms and signs involving the circulatory and respiratory systems: Secondary | ICD-10-CM | POA: Diagnosis not present

## 2019-08-19 DIAGNOSIS — Z79899 Other long term (current) drug therapy: Secondary | ICD-10-CM | POA: Diagnosis not present

## 2019-08-19 DIAGNOSIS — X58XXXA Exposure to other specified factors, initial encounter: Secondary | ICD-10-CM | POA: Diagnosis not present

## 2019-08-19 DIAGNOSIS — Y999 Unspecified external cause status: Secondary | ICD-10-CM | POA: Insufficient documentation

## 2019-08-19 DIAGNOSIS — Z20822 Contact with and (suspected) exposure to covid-19: Secondary | ICD-10-CM | POA: Insufficient documentation

## 2019-08-19 DIAGNOSIS — T17308A Unspecified foreign body in larynx causing other injury, initial encounter: Secondary | ICD-10-CM | POA: Diagnosis not present

## 2019-08-19 DIAGNOSIS — Y92129 Unspecified place in nursing home as the place of occurrence of the external cause: Secondary | ICD-10-CM | POA: Diagnosis not present

## 2019-08-19 DIAGNOSIS — Y939 Activity, unspecified: Secondary | ICD-10-CM | POA: Insufficient documentation

## 2019-08-19 DIAGNOSIS — F0390 Unspecified dementia without behavioral disturbance: Secondary | ICD-10-CM | POA: Insufficient documentation

## 2019-08-19 DIAGNOSIS — I129 Hypertensive chronic kidney disease with stage 1 through stage 4 chronic kidney disease, or unspecified chronic kidney disease: Secondary | ICD-10-CM | POA: Diagnosis not present

## 2019-08-19 DIAGNOSIS — R05 Cough: Secondary | ICD-10-CM

## 2019-08-19 DIAGNOSIS — R059 Cough, unspecified: Secondary | ICD-10-CM

## 2019-08-19 HISTORY — DX: Depression, unspecified: F32.A

## 2019-08-19 LAB — CBC WITH DIFFERENTIAL/PLATELET
Abs Immature Granulocytes: 0.03 10*3/uL (ref 0.00–0.07)
Basophils Absolute: 0.1 10*3/uL (ref 0.0–0.1)
Basophils Relative: 1 %
Eosinophils Absolute: 0.4 10*3/uL (ref 0.0–0.5)
Eosinophils Relative: 4 %
HCT: 39.3 % (ref 36.0–46.0)
Hemoglobin: 12.1 g/dL (ref 12.0–15.0)
Immature Granulocytes: 0 %
Lymphocytes Relative: 20 %
Lymphs Abs: 1.8 10*3/uL (ref 0.7–4.0)
MCH: 28.9 pg (ref 26.0–34.0)
MCHC: 30.8 g/dL (ref 30.0–36.0)
MCV: 93.8 fL (ref 80.0–100.0)
Monocytes Absolute: 0.6 10*3/uL (ref 0.1–1.0)
Monocytes Relative: 7 %
Neutro Abs: 5.8 10*3/uL (ref 1.7–7.7)
Neutrophils Relative %: 68 %
Platelets: 286 10*3/uL (ref 150–400)
RBC: 4.19 MIL/uL (ref 3.87–5.11)
RDW: 14 % (ref 11.5–15.5)
WBC: 8.6 10*3/uL (ref 4.0–10.5)
nRBC: 0 % (ref 0.0–0.2)

## 2019-08-19 LAB — RESPIRATORY PANEL BY RT PCR (FLU A&B, COVID)
Influenza A by PCR: NEGATIVE
Influenza B by PCR: NEGATIVE
SARS Coronavirus 2 by RT PCR: NEGATIVE

## 2019-08-19 LAB — BASIC METABOLIC PANEL
Anion gap: 10 (ref 5–15)
BUN: 17 mg/dL (ref 8–23)
CO2: 27 mmol/L (ref 22–32)
Calcium: 9 mg/dL (ref 8.9–10.3)
Chloride: 104 mmol/L (ref 98–111)
Creatinine, Ser: 0.88 mg/dL (ref 0.44–1.00)
GFR calc Af Amer: >60 mL/min (ref >60)
GFR calc non Af Amer: 57 mL/min — ABNORMAL LOW (ref >60)
Glucose, Bld: 141 mg/dL — ABNORMAL HIGH (ref 70–99)
Potassium: 4.3 mmol/L (ref 3.5–5.1)
Sodium: 141 mmol/L (ref 135–145)

## 2019-08-19 MED ORDER — IOHEXOL 300 MG/ML  SOLN
100.0000 mL | Freq: Once | INTRAMUSCULAR | Status: AC | PRN
  Administered 2019-08-19: 17:00:00 100 mL via INTRAVENOUS

## 2019-08-19 NOTE — ED Notes (Signed)
ED staff at bedside, asked [place] to return for imaging in 15 min.

## 2019-08-19 NOTE — ED Notes (Addendum)
Per radiology tech pt not able to tolerate lying flat for exam without increased secretions potentially compromising airway. sats 96% but continues copious secretion via nasal/mouth with lying down/reclining. Pt returned to room.

## 2019-08-19 NOTE — ED Notes (Signed)
Spoke w/ RN; exams on hold until further notice

## 2019-08-19 NOTE — ED Notes (Signed)
Patient transported to CT 

## 2019-08-19 NOTE — ED Notes (Signed)
  Patient tolerated PO challenge well.  Patient drank about 6 oz of water through a straw without stopping.  No coughing or choking episodes afterwards.  Vitals remained normal during PO challenge.

## 2019-08-19 NOTE — ED Notes (Signed)
Pt coughed up 1cm solid undigested food.

## 2019-08-19 NOTE — Discharge Instructions (Signed)
I have attached information regarding diet.  Follow-up with primary care provider for reevaluation.  Return to the emergency department if any concerning signs or symptoms develop such as fevers, persistent choking, weakness, vomiting, or loss of consciousness.

## 2019-08-19 NOTE — ED Provider Notes (Signed)
[facility] EMERGENCY DEPARTMENT Provider Note   CSN: [id number] Arrival date & time: 08/19/19  1434     History Chief Complaint  Patient presents with  . Choking   Level 5 caveat due to dementia Casey RavelingCheryl Schaus is a 86 y.o. female with history of dementia, depression, hypertension presents sent from skilled nursing facility found with secretions coming from her nose and mouth.  She is reportedly not at her mental status baseline.  She was found with secretions all over her clothing.  On my assessment she is alert but does not follow commands or answer questions.  She is nonverbal.  She exhibits coarse breath sounds and appears to have some difficulty swallowing but has no drooling.  She will intermittently have frothy white secretions coming from her nares bilaterally.   The history is provided by medical records and the EMS personnel. The history is limited by the condition of the patient.       Past Medical History:  Diagnosis Date  . Dementia (HCC) 07/18/2018  . Depression   . Hearing loss 07/18/2018  . Hypertension 07/18/2018    Patient Active Problem List   Diagnosis Date Noted  . Dementia (HCC) 07/18/2018  . Hypertension 07/18/2018  . UTI (urinary tract infection) 07/18/2018  . Acute renal failure superimposed on stage 2 chronic kidney disease (HCC) 07/18/2018  . Fracture of olecranon process of right ulna 07/18/2018  . Fall 07/18/2018    Past Surgical History:  Procedure Laterality Date  . PACEMAKER PLACEMENT       OB History   No obstetric history on file.     Family History  Problem Relation Age of Onset  . Dementia Sister   . Dementia Brother     Social History   Tobacco Use  . Smoking status: Never Smoker  . Smokeless tobacco: Never Used  Substance Use Topics  . Alcohol use: Not Currently  . Drug use: Never    Home Medications Prior to Admission medications   Medication Sig Start Date End Date Taking? Authorizing Provider   acetaminophen (TYLENOL) 325 MG tablet Take 2 tablets (650 mg total) by mouth every 6 (six) hours as needed for mild pain (or Fever >/= 101). 07/22/18   Kathlen ModyAkula, Vijaya, MD  atenolol (TENORMIN) 25 MG tablet Take 25 mg by mouth daily. 05/17/18   [provider]  busPIRone (BUSPAR) 10 MG tablet Take 10 mg by mouth daily. 07/07/19   [provider]  cephALEXin (KEFLEX) 500 MG capsule Take 1 capsule (500 mg total) by mouth 3 (three) times daily for 7 days. 08/13/19 08/20/19  Lorelee NewGreen, Garrett L, PA-C  docusate sodium (COLACE) 100 MG capsule Take 1 capsule (100 mg total) by mouth every 12 (twelve) hours. 08/13/19   Lorelee NewGreen, Garrett L, PA-C  doxylamine, Sleep, (UNISOM) 25 MG tablet Take 1 tablet by mouth at bedtime.    [provider]  Ensure Max Protein (ENSURE MAX PROTEIN) LIQD Take 330 mLs (11 oz total) by mouth daily. 07/22/18   Kathlen ModyAkula, Vijaya, MD  feeding supplement, ENSURE ENLIVE, (ENSURE ENLIVE) LIQD Take 237 mLs by mouth 2 (two) times daily between meals. 07/22/18   Kathlen ModyAkula, Vijaya, MD  methocarbamol (ROBAXIN) 500 MG tablet Take 1 tablet (500 mg total) by mouth every 8 (eight) hours as needed for muscle spasms. 07/22/18   Kathlen ModyAkula, Vijaya, MD  PARoxetine (PAXIL) 30 MG tablet Take 30 mg by mouth daily. 05/17/18   [provider]  senna-docusate (SENOKOT-S) 8.6-50 MG tablet Take 1  tablet by mouth at bedtime as needed for mild constipation. 07/22/18   Kathlen Mody, MD    Allergies    Lorazepam  Review of Systems   Review of Systems  Unable to perform ROS: Dementia    Physical Exam Updated Vital Signs BP 102/80   Pulse 90   Temp (!) 97.4 F (36.3 C) (Axillary)   Resp 13   SpO2 97%   Physical Exam Vitals and nursing note reviewed.  Constitutional:      General: She is not in acute distress.    Appearance: She is well-developed.  HENT:     Head: Normocephalic and atraumatic.     Nose:     Comments: White frothy sputum coming from the bilateral nares occasionally     Mouth/Throat:     Comments: Unable to assess due to lack of patient cooperation Eyes:     General:        Right eye: No discharge.        Left eye: No discharge.     Conjunctiva/sclera: Conjunctivae normal.     Pupils: Pupils are equal, round, and reactive to light.  Neck:     Vascular: No JVD.     Trachea: No tracheal deviation.     Comments: Coarse upper airway sounds Cardiovascular:     Rate and Rhythm: Normal rate and regular rhythm.     Pulses: Normal pulses.  Pulmonary:     Comments: SPO2 saturations 96 to 99% on room air.  Scattered coarse breath sounds in the lungs.   Abdominal:     General: There is no distension.     Palpations: Abdomen is soft.     Tenderness: There is no abdominal tenderness. There is no guarding.  Musculoskeletal:     Cervical back: Neck supple.  Skin:    General: Skin is warm and dry.     Findings: No erythema.  Neurological:     Mental Status: She is alert.  Psychiatric:        Behavior: Behavior normal.     ED Results / Procedures / Treatments   Labs (all labs ordered are listed, but only abnormal results are displayed) Labs Reviewed  BASIC METABOLIC PANEL - Abnormal; Notable for the following components:      Result Value   Glucose, Bld 141 (*)    GFR calc non Af Amer 57 (*)    All other components within normal limits  RESPIRATORY PANEL BY RT PCR (FLU A&B, COVID)  CBC WITH DIFFERENTIAL/PLATELET    EKG None  Radiology DG Neck Soft Tissue  Result Date: 08/19/2019 CLINICAL DATA:  Choking and coughing. Question of food bolus. EXAM: NECK SOFT TISSUES - 1+ VIEW COMPARISON:  CT cervical spine on 07/18/2018 FINDINGS: The hypopharynx is distended. No radiopaque foreign body or abnormal soft tissue contour. Normal appearance of the epiglottis. The esophagus below the level of C2 is not well evaluated because of patient body habitus. Significant degenerative changes in the spine. There is asymmetric density at the lung apices, RIGHT greater  than LEFT. Trachea is deviated towards the RIGHT. Findings are related to goiter, probably unchanged since previous CT exam. IMPRESSION: 1. Distended hypopharynx without radiopaque foreign body. Soft tissue foreign body is not excluded. 2. RIGHT apical opacity and deviation of the trachea towards the RIGHT, related to goiter. 3. Degenerative changes in the spine. Electronically Signed   By: Norva Pavlov M.D.   On: 08/19/2019 16:17   DG Chest 2 View  Result  Date: 08/19/2019 CLINICAL DATA:  86 year old female with cough and choking. EXAM: CHEST - 2 VIEW COMPARISON:  Chest radiograph dated 07/18/2018. FINDINGS: There are bibasilar atelectasis. No focal consolidation, pleural effusion or pneumothorax. Stable cardiac silhouette. Left pectoral pacemaker device. A hiatal hernia may be present. No acute osseous pathology. IMPRESSION: No acute cardiopulmonary process. Electronically Signed   By: Elgie Collard M.D.   On: 08/19/2019 16:10   CT Soft Tissue Neck W Contrast  Result Date: 08/19/2019 CLINICAL DATA:  86 year old female with sputum coming from nose and mouth. EXAM: CT NECK WITH CONTRAST TECHNIQUE: Multidetector CT imaging of the neck was performed using the standard protocol following the bolus administration of intravenous contrast. CONTRAST:  OMNIPAQUE IOHEXOL 300 MG/ML  SOLN COMPARISON:  Chest CT today reported separately. [facility] CT head and cervical spine 07/18/2018. FINDINGS: Pharynx and larynx: Laryngeal and pharyngeal soft tissue contours are within normal limits. Negative parapharyngeal spaces. Negative retropharyngeal space. Salivary glands: Sublingual space, submandibular and parotid glands are within normal limits. Thyroid: Chronically very large thyroid goiter, also mostly visible on the 2020 comparison. The isthmus measures up to 36 mm in thickness. The right lobe is 12 cm in length with extensive retrosternal extension into the superior mediastinum (series 2, image 77  and series 4, image 42). The left lobe is smaller but still with retrosternal/mediastinal extension. Overall thyroid volume estimated at 191 mL. Coarse calcifications and hypodense nodules throughout the bilateral thyroid but not clinically significant; no follow-up imaging recommended (ref: J Am Coll Radiol. 2015 Feb;12(2): 143-50). There is associated regional mass effect, although no significant narrowing of the trachea. There is no malignant appearing soft tissue plane invasion or regional lymphadenopathy. Lymph nodes: Negative. No cervical lymphadenopathy. Vascular: Major vascular structures in the neck and at the skull base are patent. The right IJ is dominant. Limited intracranial: Negative for age; generalized cerebral volume loss. Visualized orbits: Negative, postoperative changes to the left globe. Mastoids and visualized paranasal sinuses: Improved paranasal sinus aeration from last year, minor right sphenoid mucosal thickening today. Skeleton: Motion artifact at the mandible. Stable cervical spine degeneration and chronic mild C7 compression fracture. There is a severe chronic appearing T4 compression fracture. Upper chest: Reported separately today. IMPRESSION: 1. 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 Mass effect at the thoracic inlet, although no significant tracheal narrowing, and no malignant features. 2. No acute findings in the neck. 3. CT Chest today reported separately. Electronically Signed   By: Odessa Fleming M.D.   On: 08/19/2019 20:05   CT Chest W Contrast  Result Date: 08/19/2019 CLINICAL DATA:  Sputum common from nose and mouth, evaluate for aspiration EXAM: CT CHEST WITH CONTRAST TECHNIQUE: Multidetector CT imaging of the chest was performed during intravenous contrast administration. CONTRAST:  OMNIPAQUE IOHEXOL 300 MG/ML  SOLN COMPARISON:  None. FINDINGS: Cardiovascular: Heart is normal in size.  No pericardial effusion. No evidence of  thoracic aortic aneurysm. Atherosclerotic calcifications of the aortic arch. Mild coronary atherosclerosis of the LAD. Left subclavian pacemaker. Mediastinum/Nodes: No suspicious mediastinal lymphadenopathy. Large, heterogeneous thyroid gland with right substernal goiter (series 2/image 13). In the setting of significant comorbidities or limited life expectancy, no follow-up recommended (ref: J Am Coll Radiol. 2015 Feb;12(2): 143-50). Lungs/Pleura: Evaluation of the lung parenchyma is constrained by respiratory motion. Mild subpleural patchy opacities in the bilateral lower lobes. Mild patchy opacity in the posterior right upper lobe (series 3/image 37). This appearance favors scarring related to prior atypical infection  rather than acute pneumonia. No findings suspicious for aspiration. No suspicious pulmonary nodules. No pleural effusion or pneumothorax. Upper Abdomen: Visualized upper abdomen is notable for vascular calcifications and a 15 mm probable cyst in the right hepatic lobe (series 2/image 39). Musculoskeletal: Mild superior endplate compression fracture deformity at C7 (sagittal image 100). Severe compression fracture deformity at T4 with mild retropulsion (sagittal image 96). These findings are age indeterminate but likely chronic. IMPRESSION: Mild subpleural patchy opacities in the posterior right upper lobe and bilateral lower lobes, favoring scarring dated to prior atypical infection rather than acute pneumonia. No findings suspicious for aspiration. Mild superior endplate compression fracture deformity at C7. Severe compression fracture deformity at T4 with mild retropulsion. These findings are age indeterminate but likely chronic. Aortic Atherosclerosis (ICD10-I70.0). Electronically Signed   By: Julian Hy M.D.   On: 08/19/2019 20:01    Procedures Procedures (including critical care time)  Medications Ordered in ED Medications  iohexol (OMNIPAQUE) 300 MG/ML solution 100 mL (100 mLs  Intravenous Contrast Given 08/19/19 1719)    ED Course  I have reviewed the triage vital signs and the nursing notes.  Pertinent labs & imaging results that were available during my care of the patient were reviewed by me and considered in my medical decision making (see chart for details).    MDM Rules/Calculators/A&P                      Patient presents brought in by EMS sent from facility for evaluation of choking, inability to tolerate secretions.  She is afebrile, vital signs are stable in the ED.  SPO2 saturations are within normal limits.  On evaluation she has foamy secretions coming from the bilateral nares.  I am unable to examine the posterior oropharynx due to patient noncompliance as she clamps down on the tongue depressor with her teeth when I attempt to use it and will not follow my commands otherwise.  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  She has coarse breath sounds that can be auscultated in the anterior neck as well as some transmitted coarse breath sounds in the lung fields.  She was found with secretions and foodstuffs noted all over her clothing.  I am concerned that she may have aspirated although her O2 saturations are within normal limits.  Plain films of the chest and soft tissues of the neck show no evidence of acute cardiopulmonary abnormalities but she has a distended hypopharynx without radiopaque foreign body.  She has tracheal deviation related to goiter.  She was unable to tolerate CT of the chest and soft tissue of the neck due to choking and worsening secretions from the nares while laying flat.  She did not tolerate suctioning.  I am concerned that she has an obstruction somewhere in the oropharynx causing her symptoms.  CONSULT: Spoke with Dr. Redmond Baseman with ENT.  He recommends possible transfer to [facility] for scope although states that consult to GI within our system could be reasonable for upper endoscopy.  Spoke with the patient's  daughter Casey Lam.  She is the patient's power of attorney.  She states that she would like everything that can be done for her mother within reason.  She confirms that the patient is a DNR.  We discussed the possibility of sedation for the scope and she would like to proceed with this option if the patient does not improve in the ED.  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  She also notes that the patient had contracted Covid in January but survived this and is now fully vaccinated against Covid.  Lab work reviewed and interpreted by myself shows no leukocytosis, no anemia, no renal insufficiency, no metabolic derangements.  Overall reassuring.  Her Covid test is negative.  While in the ED the patient began to cough more.  She was able to expectorate some more food and secretions.  On reevaluation she is sitting upright, resting comfortably, more alert and responsive/reactive to her environment.  She will smile.  When asked how she feels she states "I am having fun".  She will otherwise not follow commands or answer other questions which is at her mental status baseline.  When I lay her flat she no longer has secretions coming from her nares.  She was able to tolerate CT of the chest and soft tissues of the neck which shows no evidence of aspiration pneumonia, mild subpleural patchy opacities in the posterior right upper lobe and bilateral lower lobes which is favored to be indicative of scarring rather than acute pneumonia.  Also no acute findings within the neck, no foreign body is noted.  On reevaluation the patient is resting comfortably, able to swallow and drink a full glass of water without any difficulty.  I suspect that she expectorated the food that was causing the oropharyngeal obstruction.  With no signs of aspiration on imaging and stable SPO2 saturations and no signs of respiratory distress I think it would be reasonable to send her back to  her facility for close observation and monitoring.   I spoke with patient's daughter and discussed her work-up and ED course.  We discussed that it could be reasonable to admit the patient overnight for observation but she would like the patient to go back to her facility due to her dementia and feels that she would be much more comfortable there.  I think this is reasonable at this time.  We discussed indications for return to the ED.  Will provide information for bite sized diet for patient to go back to the facility with.  Patient's daughter verbalized understanding of and agreement with plan and patient is stable for discharge at this time.  The patient was seen and evaluate by Dr. Jacqulyn Bath who agrees with assessment and plan at this time.  Final Clinical Impression(s) / ED Diagnoses Final diagnoses:  Choking, initial encounter    Rx / DC Orders ED Discharge Orders    None       Casey Sewer, PA-C 08/20/19 0014    Maia Plan, MD 08/20/19 1058

## 2019-08-19 NOTE — ED Notes (Signed)
[facility] notified for ENT consult

## 2019-08-19 NOTE — ED Triage Notes (Signed)
Arrived ems  Per nursing home staff pt found pt with sputum coming from nose and mouth,  Not acting normal for her,   Stated mentally pt is at base line

## 2019-08-19 NOTE — ED Notes (Signed)
ED Provider at bedside. 

## 2019-08-19 NOTE — ED Notes (Signed)
Unable to lay pt down for scan w/o choking; pt would keep tossing head and throwing arms when trying to move pt.  Spoke with PA, was told to put pulse ox monitor on pt and try and perform exam.  Pt continued to produce large amounts of sputum from both nostrils and mouth even while lying semi-flat; coughing and throwing of arms continued.  Spoke with nurse about concerns of compromising airway when lying flat for the scan;   Nurse to try and speak with PA about concerns

## 2019-08-19 NOTE — ED Notes (Signed)
  Report given to [facility] at [facility].  [facility] loading up patient now for transport.

## 2019-09-17 ENCOUNTER — Emergency Department (HOSPITAL_COMMUNITY)
Admission: EM | Admit: 2019-09-17 | Discharge: 2019-09-17 | Disposition: A | Discharge status: Home / Self Care | Payer: Medicare Other | Attending: Emergency Medicine | Admitting: Emergency Medicine

## 2019-09-17 ENCOUNTER — Emergency Department (HOSPITAL_COMMUNITY): Payer: Medicare Other

## 2019-09-17 ENCOUNTER — Encounter (HOSPITAL_COMMUNITY): Payer: Self-pay

## 2019-09-17 ENCOUNTER — Other Ambulatory Visit: Payer: Self-pay

## 2019-09-17 DIAGNOSIS — Y999 Unspecified external cause status: Secondary | ICD-10-CM | POA: Diagnosis not present

## 2019-09-17 DIAGNOSIS — Y939 Activity, unspecified: Secondary | ICD-10-CM | POA: Insufficient documentation

## 2019-09-17 DIAGNOSIS — W19XXXA Unspecified fall, initial encounter: Secondary | ICD-10-CM | POA: Diagnosis not present

## 2019-09-17 DIAGNOSIS — Y92122 Bedroom in nursing home as the place of occurrence of the external cause: Secondary | ICD-10-CM | POA: Insufficient documentation

## 2019-09-17 DIAGNOSIS — F0390 Unspecified dementia without behavioral disturbance: Secondary | ICD-10-CM | POA: Diagnosis not present

## 2019-09-17 DIAGNOSIS — S60052A Contusion of left little finger without damage to nail, initial encounter: Secondary | ICD-10-CM | POA: Insufficient documentation

## 2019-09-17 DIAGNOSIS — I10 Essential (primary) hypertension: Secondary | ICD-10-CM | POA: Diagnosis not present

## 2019-09-17 DIAGNOSIS — S59911A Unspecified injury of right forearm, initial encounter: Secondary | ICD-10-CM | POA: Diagnosis present

## 2019-09-17 DIAGNOSIS — S51811A Laceration without foreign body of right forearm, initial encounter: Secondary | ICD-10-CM

## 2019-09-17 DIAGNOSIS — S8001XA Contusion of right knee, initial encounter: Secondary | ICD-10-CM | POA: Diagnosis not present

## 2019-09-17 DIAGNOSIS — S8002XA Contusion of left knee, initial encounter: Secondary | ICD-10-CM | POA: Diagnosis not present

## 2019-09-17 DIAGNOSIS — Z79899 Other long term (current) drug therapy: Secondary | ICD-10-CM | POA: Diagnosis not present

## 2019-09-17 DIAGNOSIS — H05231 Hemorrhage of right orbit: Secondary | ICD-10-CM | POA: Insufficient documentation

## 2019-09-17 LAB — COMPREHENSIVE METABOLIC PANEL
ALT: 15 U/L (ref 0–44)
AST: 23 U/L (ref 15–41)
Albumin: 3.5 g/dL (ref 3.5–5.0)
Alkaline Phosphatase: 74 U/L (ref 38–126)
Anion gap: 8 (ref 5–15)
BUN: 23 mg/dL (ref 8–23)
CO2: 29 mmol/L (ref 22–32)
Calcium: 9.4 mg/dL (ref 8.9–10.3)
Chloride: 107 mmol/L (ref 98–111)
Creatinine, Ser: 0.72 mg/dL (ref 0.44–1.00)
GFR calc Af Amer: >60 mL/min (ref >60)
GFR calc non Af Amer: >60 mL/min (ref >60)
Glucose, Bld: 99 mg/dL (ref 70–99)
Potassium: 5 mmol/L (ref 3.5–5.1)
Sodium: 144 mmol/L (ref 135–145)
Total Bilirubin: 0.6 mg/dL (ref 0.3–1.2)
Total Protein: 6.7 g/dL (ref 6.5–8.1)

## 2019-09-17 LAB — CBC
HCT: 39.1 % (ref 36.0–46.0)
Hemoglobin: 11.9 g/dL — ABNORMAL LOW (ref 12.0–15.0)
MCH: 28 pg (ref 26.0–34.0)
MCHC: 30.4 g/dL (ref 30.0–36.0)
MCV: 92 fL (ref 80.0–100.0)
Platelets: 290 10*3/uL (ref 150–400)
RBC: 4.25 MIL/uL (ref 3.87–5.11)
RDW: 13.8 % (ref 11.5–15.5)
WBC: 10.8 10*3/uL — ABNORMAL HIGH (ref 4.0–10.5)
nRBC: 0 % (ref 0.0–0.2)

## 2019-09-17 LAB — CK: Total CK: 74 U/L (ref 38–234)

## 2019-09-17 NOTE — ED Provider Notes (Signed)
[facility]-EMERGENCY DEPT Provider Note   CSN: [id number] Arrival date & time: 09/17/19  1132     History No chief complaint on file.   Casey Lam is a 86 y.o. female presenting for evaluation after presumed fall.  Level 5 caveat due to dementia.  History obtained mostly from EMS.  Per EMS, patient was found by nursing home staff laying on the floor next to her bed.  It is presumed that she fell while trying to get out of bed.  She was found at 945 this morning.  She was noted to have injuries around her right eye and her left pinky and a right forearm skin tear.  She is mostly wheelchair-bound at baseline.  She has a history of dementia.  She is not on blood thinners.  HPI     Past Medical History:  Diagnosis Date  . Dementia (HCC) 07/18/2018  . Depression   . Hearing loss 07/18/2018  . Hypertension 07/18/2018    Patient Active Problem List   Diagnosis Date Noted  . Dementia (HCC) 07/18/2018  . Hypertension 07/18/2018  . UTI (urinary tract infection) 07/18/2018  . Acute renal failure superimposed on stage 2 chronic kidney disease (HCC) 07/18/2018  . Fracture of olecranon process of right ulna 07/18/2018  . Fall 07/18/2018    Past Surgical History:  Procedure Laterality Date  . PACEMAKER PLACEMENT       OB History   No obstetric history on file.     Family History  Problem Relation Age of Onset  . Dementia Sister   . Dementia Brother     Social History   Tobacco Use  . Smoking status: Never Smoker  . Smokeless tobacco: Never Used  Substance Use Topics  . Alcohol use: Not Currently  . Drug use: Never    Home Medications Prior to Admission medications   Medication Sig Start Date End Date Taking? Authorizing Provider  acetaminophen (TYLENOL) 325 MG tablet Take 2 tablets (650 mg total) by mouth every 6 (six) hours as needed for mild pain (or Fever >/= 101). 07/22/18  Yes Kathlen Mody, MD  atenolol (TENORMIN) 25 MG tablet Take 25  mg by mouth daily. 05/17/18  Yes [provider]  busPIRone (BUSPAR) 10 MG tablet Take 10 mg by mouth daily at 2 PM.  07/07/19  Yes [provider]  docusate sodium (COLACE) 100 MG capsule Take 1 capsule (100 mg total) by mouth every 12 (twelve) hours. 08/13/19  Yes Lorelee New, PA-C  doxylamine, Sleep, (UNISOM) 25 MG tablet Take 12.5 mg by mouth at bedtime.    Yes [provider]  feeding supplement, ENSURE ENLIVE, (ENSURE ENLIVE) LIQD Take 237 mLs by mouth 2 (two) times daily between meals. Patient taking differently: Take 237 mLs by mouth in the morning, at noon, and at bedtime.  07/22/18  Yes Kathlen Mody, MD  loperamide (IMODIUM A-D) 2 MG tablet Take 2-4 mg by mouth See admin instructions. Take 2 tablets by mouth for first dose, then 1 tablet after each loose stool as needed.   Yes [provider]  methocarbamol (ROBAXIN) 500 MG tablet Take 1 tablet (500 mg total) by mouth every 8 (eight) hours as needed for muscle spasms. 07/22/18  Yes Kathlen Mody, MD  PARoxetine (PAXIL) 30 MG tablet Take 30 mg by mouth daily at 12 noon.  05/17/18  Yes [provider]  Ensure Max Protein (ENSURE MAX PROTEIN) LIQD Take 330 mLs (11 oz total) by mouth daily.  Patient not taking: Reported on 09/17/2019 07/22/18   Kathlen Mody, MD  senna-docusate (SENOKOT-S) 8.6-50 MG tablet Take 1 tablet by mouth at bedtime as needed for mild constipation. Patient not taking: Reported on 09/17/2019 07/22/18   Kathlen Mody, MD    Allergies    Lorazepam  Review of Systems   Review of Systems  Unable to perform ROS: Dementia  HENT:       Head trauma  Musculoskeletal: Positive for arthralgias (L pinky contusion).  Skin: Positive for wound (skin tear R forearm).  Hematological: Does not bruise/bleed easily.    Physical Exam Updated Vital Signs BP (!) 160/84   Pulse 89   Temp 97.6 F (36.4 C) (Axillary)   Resp 18   SpO2 96%   Physical Exam Vitals and nursing note reviewed.   Constitutional:      General: She is not in acute distress.    Appearance: She is well-developed.     Comments: Alert to person  HENT:     Head: Normocephalic.     Comments: Ecchymosis and swelling surrounding the right eye and zygoma.  No hemotympanum or nasal septal hematoma.  No obvious trauma the mouth.  No injuries noted elsewhere in the head. Eyes:     Pupils: Pupils are equal, round, and reactive to light.     Comments: PERRLA.  EOM difficult to assess as patient is not cooperating with exam, but no obvious entrapment  Neck:     Comments: In c-collar.  No tenderness palpation of midline C-spine Cardiovascular:     Rate and Rhythm: Normal rate and regular rhythm.     Pulses: Normal pulses.  Pulmonary:     Effort: Pulmonary effort is normal. No respiratory distress.     Breath sounds: Normal breath sounds. No wheezing.     Comments: Clear lung sounds Abdominal:     General: There is no distension.     Palpations: Abdomen is soft. There is no mass.     Tenderness: There is no abdominal tenderness. There is no guarding or rebound.  Musculoskeletal:     Comments: Contusion of the distal left pinky.  Small skin lac of the right forearm without bleeding.  No injury or deformity noted to the remaining extremities. Slight contusion of anterior bilateral knees without swelling or tenderness. Flexing and extending knees without pain.   Skin:    General: Skin is warm and dry.     Capillary Refill: Capillary refill takes less than 2 seconds.  Neurological:     Comments: At baseline mental status per staff     ED Results / Procedures / Treatments   Labs (all labs ordered are listed, but only abnormal results are displayed) Labs Reviewed  CBC - Abnormal; Notable for the following components:      Result Value   WBC 10.8 (*)    Hemoglobin 11.9 (*)    All other components within normal limits  URINE CULTURE  COMPREHENSIVE METABOLIC PANEL  CK  URINALYSIS, ROUTINE W REFLEX  MICROSCOPIC    EKG EKG Interpretation  Date/Time:  Saturday Sep 17 2019 12:02:22 EDT Ventricular Rate:  90 PR Interval:    QRS Duration: 90 QT Interval:  384 QTC Calculation: 470 R Axis:   -47 Text Interpretation: Sinus rhythm Left atrial enlargement Left anterior fascicular block Abnormal R-wave progression, late transition Baseline wander in lead(s) V1 No STEMI Confirmed by Alona Bene [phone]) on 09/17/2019 12:10:12 PM   Radiology DG Chest 2 View  Result Date:  09/17/2019 CLINICAL DATA:  Pain following fall EXAM: CHEST - 2 VIEW COMPARISON:  August 19, 2019 FINDINGS: Lungs are clear. Heart is upper normal in size with pulmonary vascularity normal. Pacemaker leads are attached to the right atrium and right ventricle. No pneumothorax. No adenopathy. Prominence in the right paratracheal region is likely due to great vessel prominence in this age group, a stable finding. No bone lesions. IMPRESSION: No edema or consolidation. Heart upper normal in size. Pacemaker leads attached to right atrium and right ventricle. Electronically Signed   By: Lowella Grip III M.D.   On: 09/17/2019 12:33   CT Head Wo Contrast  Result Date: 09/17/2019 CLINICAL DATA:  Unwitnessed fall, head trauma EXAM: CT HEAD WITHOUT CONTRAST TECHNIQUE: Contiguous axial images were obtained from the base of the skull through the vertex without intravenous contrast. COMPARISON:  07/18/2018 FINDINGS: Brain: The brainstem, cerebellum, cerebral peduncles, thalami, basal ganglia, basilar cisterns, and ventricular system appear within normal limits. Periventricular white matter and corona radiata hypodensities favor chronic ischemic microvascular white matter disease. Potential small remote lacunar infarct along the anterior limb of the right internal capsule. No intracranial hemorrhage, mass lesion, or acute CVA. Vascular: Unremarkable Skull: Unremarkable Sinuses/Orbits: Right periorbital hematoma. Otherwise unremarkable. Other: No  supplemental non-categorized findings. IMPRESSION: 1. No acute intracranial findings. 2. Periventricular white matter and corona radiata hypodensities favor chronic ischemic microvascular white matter disease. 3. Right periorbital hematoma. Electronically Signed   By: Van Clines M.D.   On: 09/17/2019 12:54   CT Cervical Spine Wo Contrast  Result Date: 09/17/2019 CLINICAL DATA:  Neck trauma, fall EXAM: CT CERVICAL SPINE WITHOUT CONTRAST TECHNIQUE: Multidetector CT imaging of the cervical spine was performed without intravenous contrast. Multiplanar CT image reconstructions were also generated. COMPARISON:  07/18/2018 FINDINGS: Alignment: 2 mm degenerative retrolisthesis at C3-4 Skull base and vertebrae: Remote superior endplate compression fracture at C7 with 3 mm posterior bony retropulsion, overall similar to prior. Chronic T4 compression fracture bony retropulsion, as shown on 08/19/2019. Loss of intervertebral disc height of multiple levels due to degenerative disc disease. No acute fracture is identified. Soft tissues and spinal canal: Right common carotid atherosclerotic calcification. 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 This has been investigated in the past such as on the CT neck from 08/19/2019. Not clinically significant; no follow-up imaging recommended (ref: J Am Coll Radiol. 2015 Feb;12(2): 143-50). Disc levels: Osseous foraminal stenosis due to uncinate and facet spurring is present on the right at C4-5 and C5-6; and on the left C3-4, C4-5, and C5-6. Upper chest: Branch vessel atherosclerosis. Other: No supplemental non-categorized findings. IMPRESSION: 1. No acute cervical spine findings. 2. Remote superior endplate compression fractures at C7 and T4, both with associated chronic bony retropulsion. 3. Cervical spondylosis and degenerative disc disease causing multilevel foraminal impingement. 4. 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 This has been investigated in  the past such as on the CT neck from 08/19/2019. Not clinically significant; no follow-up imaging recommended. 5. Atherosclerosis. Aortic Atherosclerosis (ICD10-I70.0). Electronically Signed   By: Van Clines M.D.   On: 09/17/2019 13:01   DG Hand Complete Left  Result Date: 09/17/2019 CLINICAL DATA:  Pain following fall EXAM: LEFT HAND - COMPLETE 3+ VIEW COMPARISON:  None. FINDINGS: Frontal, oblique, and lateral views were obtained. No fracture or dislocation. There is osteoarthritic change in all PIP and DIP joints, as well as in the first carpal-metacarpal and scaphotrapezial joints. There is underlying osteoporosis. No erosive changes. IMPRESSION: Multilevel osteoarthritic change. Bones osteoporotic. No  fracture or dislocation. Electronically Signed   By: Bretta Bang III M.D.   On: 09/17/2019 12:38   CT Maxillofacial Wo Contrast  Result Date: 09/17/2019 CLINICAL DATA:  Unwitnessed fall, periorbital hematoma on the right. EXAM: CT MAXILLOFACIAL WITHOUT CONTRAST TECHNIQUE: Multidetector CT imaging of the maxillofacial structures was performed. Multiplanar CT image reconstructions were also generated. COMPARISON:  None. FINDINGS: Osseous: No facial fracture or acute bony findings identified. Orbits: Right periorbital hematoma in the soft tissues, also extending anterior to the right maxilla and zygomatic arch. No intraorbital abnormality is identified. Sinuses: Unremarkable Soft tissues: Right periorbital hematoma as described above. This extends along the right facial tissues. Limited intracranial: No acute intracranial findings. Please see CT head report from today. IMPRESSION: Right periorbital hematoma in the soft tissues, also extending anterior to the right maxilla and zygomatic arch. No facial fracture or acute bony findings. Electronically Signed   By: Gaylyn Rong M.D.   On: 09/17/2019 13:03    Procedures Procedures (including critical care time)  Medications Ordered in  ED Medications - No data to display  ED Course  I have reviewed the triage vital signs and the nursing notes.  Pertinent labs & imaging results that were available during my care of the patient were reviewed by me and considered in my medical decision making (see chart for details).    MDM Rules/Calculators/A&P                      Pt presenting to the ER after a presumed fall. On exam, pt has a large R periorbital hematoma, skin tear of the R forearm, and L pinky contusion. She has mild contusion of both knees, but is moving them without signs of trauma.  Obtain imaging of her head, neck, and left hand.  Left wrist and elbow without signs of fracture, I do not believe she needs imaging of her right forearm.  As event was unwitnessed, will obtain labs including CK, EKG, urine.  CT head, neck, maxillofacial negative for acute findings such as bleed or fracture.  Chest x-ray viewed interpreted by me, no pneumonia, fracture, or sign of injury.  EKG without STEMI, similar to previous.  Labs interpreted by me, overall reassuring.  Mild, nonspecific leukocytosis of 10.8.  CK is normal.  Electrolytes are stable.  Urine unable to be obtained due to patient cooperation.  As she has no fever, abdominal pain, or signs of UTI, I do not believe she needs sedation in order to get a UA.  Discussed with attending, Dr. Jeraldine Loots evaluated the patient.  I attempted to call patient's daughter x2 to discuss visit and results, however did not receive any answer.  At this time, patient appears safe for discharge.  Return precautions given.  Final Clinical Impression(s) / ED Diagnoses Final diagnoses:  Fall, initial encounter  Periorbital hematoma of right eye  Skin tear of right forearm without complication, initial encounter    Rx / DC Orders ED Discharge Orders    None       Drema Balzarine 09/17/19 2032    Gerhard Munch, MD 09/18/19 [phone]

## 2019-09-17 NOTE — Discharge Instructions (Addendum)
Continue taking home medications as prescribed.  Use tylenol as needed for pain.  Use ice for pain and swelling. Follow up with your primary care doctor as needed for further evaluation.  Return to the ER with any new, worsening, or concerning symptoms.

## 2019-09-17 NOTE — ED Notes (Signed)
Pt. Tends to bite when assisting with changing of the breif and clothes. Nurse aware.

## 2019-09-17 NOTE — ED Triage Notes (Signed)
Patient coming from nursing home [facility] with complaint of unwitness fall. Patient was found on the floor this morning by nursing staff around 0945 am. They believe she might have fell off of the bed. Patient is not on blood thinners, Right eye edema and bruising. Skin tear right wrist and  left little finger brusing and edema.

## 2019-09-17 NOTE — ED Notes (Signed)
Report was given to the [facility] staff Delilah.

## 2019-09-17 NOTE — ED Notes (Signed)
PTAR call for patient transport to [facility].

## 2019-10-06 ENCOUNTER — Other Ambulatory Visit: Payer: Self-pay

## 2019-10-06 ENCOUNTER — Non-Acute Institutional Stay: Payer: Medicare Other | Admitting: Internal Medicine

## 2019-10-06 ENCOUNTER — Encounter: Payer: Self-pay | Admitting: Internal Medicine

## 2019-10-06 DIAGNOSIS — Z515 Encounter for palliative care: Secondary | ICD-10-CM

## 2019-10-06 DIAGNOSIS — Z7189 Other specified counseling: Secondary | ICD-10-CM

## 2019-10-06 NOTE — Progress Notes (Signed)
May 27th, 2021 [facility] Palliative Care Consult Note Telephone: [phone]  Fax: [phone]  PATIENT NAME: Casey Lam DOB: 1925/08/21 MRN: [id number]  [facility] Room 48 (admission date 08/14/2018)  PRIMARY CARE PROVIDER: Angelica Pou, NP  REFERRING PROVIDER: Angelica Pou, NP   RESPONSIBLE PARTY: (dtr/HCPOA) Lynford Citizen [phone] [facility] Rosario Adie [phone]   ASSESSMENT / RECOMMENDATIONS:  1. Advance Care Planning: A. Directives: DNR. Spoke to daughter Casey Lam, who verified DNR and MOST form details. Both forms available on facility chart, and I uploaded to [facility] EMR. MOST details: DNR/DNI. Limited Additional Medical Interventions. Yes to Antibiotics and IVFs. No to feeding tube. B. Goals of Care: I spoke with daughter Casey Lam. She is the patient's power of attorney.  She goal is for her mom to be comfortable and happy; wishes to avoid hospitalizations but would treat if benefits outweighed the burdens.   2. Cognitive / Functional status: Patient alert; tends to doze in her wheelchair. She if very HOH. 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 Staff report she is resistant to wearing her L ear hearing aide. Reads lips. 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 Glasses are missing. She is affable and friendly. Staff report no behavioral issues. She participates in facility activities. On the quiet side.   Patient is independent in transfers. She can ambulate independently, but staff try to be there to assist to decrease risk of falls. She is dependent in hygiene and dressing. She is incontinent of bowel and bladder.   Staff report patient with excellent oral intake. She prefers to eat with her hands, even when staff start out with cuing her to eat with utensils. No signs of choking or aspiration. Nl bowel movements.  No pain issues or issues with dyspnea.  3. Family Supports: Patient has 6 children (5 daughters and one son) but daughter Casey Lam is the only child  who lives in [place]. Casey Lam has been limited in ability to see her mom, but is hoping to come out to visit next Monday as COVID restrictions are now loosened up a bit.   4. Follow up Palliative Care Visit: in 2-3 months or earlier prn. I called dtr Charlene and left message with updates of today's visit. I had also spoken to [facility] before seeing the patient. Westley Hummer has my contact information.  I spent 60 minutes providing this consultation from 9am to 10am. More than 50% of the time in this consultation was spent coordinating communication.   HISTORY OF PRESENT ILLNESS:  Casey Lam is a 86 y.o. female with h/o dementia, displaced fx olecran, HTN, dysphagia, major depression. 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 Pacemaker (followed by [facility]/remote PM battery check via device in room. Last check 2020 told has 5-6 years till end of battery life)   09/17/2019 ER after fall. Chest XR, L hand X ray, head/spine/Maxillofacial CT: Right periorbital hematoma. Otherwise no acute findings.   08/19/2019: ER cough/copious secretions. Resolved spontaneously ? Esophageal impaction which cleared.  Palliative Care was asked to help address goals of care.   CODE STATUS: DNR  PPS: 40% HOSPICE ELIGIBILITY/DIAGNOSIS: TBD PAST MEDICAL HISTORY:  Past Medical History:  Diagnosis Date  . Dementia (HCC) 07/18/2018  . Depression   . Hearing loss 07/18/2018  . Hypertension 07/18/2018    SOCIAL HX:  Social History   Tobacco Use  . Smoking status: Never Smoker  . Smokeless tobacco: Never Used  Substance Use Topics  .  Alcohol use: Not Currently    ALLERGIES:  Allergies  Allergen Reactions  . Lorazepam Other (See Comments)     PERTINENT MEDICATIONS:  Outpatient Encounter Medications as of 10/06/2019  Medication Sig  . acetaminophen (TYLENOL) 325 MG tablet Take 2 tablets (650 mg total) by mouth every 6 (six) hours as needed for mild pain (or Fever  >/= 101).  Marland Kitchen atenolol (TENORMIN) 25 MG tablet Take 25 mg by mouth daily.  . busPIRone (BUSPAR) 10 MG tablet Take 10 mg by mouth daily at 2 PM.   . docusate sodium (COLACE) 100 MG capsule Take 1 capsule (100 mg total) by mouth every 12 (twelve) hours.  Marland Kitchen doxylamine, Sleep, (UNISOM) 25 MG tablet Take 12.5 mg by mouth at bedtime.   . Ensure Max Protein (ENSURE MAX PROTEIN) LIQD Take 330 mLs (11 oz total) by mouth daily. (Patient not taking: Reported on 09/17/2019)  . feeding supplement, ENSURE ENLIVE, (ENSURE ENLIVE) LIQD Take 237 mLs by mouth 2 (two) times daily between meals. (Patient taking differently: Take 237 mLs by mouth in the morning, at noon, and at bedtime. )  . loperamide (IMODIUM A-D) 2 MG tablet Take 2-4 mg by mouth See admin instructions. Take 2 tablets by mouth for first dose, then 1 tablet after each loose stool as needed.  . methocarbamol (ROBAXIN) 500 MG tablet Take 1 tablet (500 mg total) by mouth every 8 (eight) hours as needed for muscle spasms.  Marland Kitchen PARoxetine (PAXIL) 30 MG tablet Take 30 mg by mouth daily at 12 noon.   . senna-docusate (SENOKOT-S) 8.6-50 MG tablet Take 1 tablet by mouth at bedtime as needed for mild constipation. (Patient not taking: Reported on 09/17/2019)   No facility-administered encounter medications on file as of 10/06/2019.    PHYSICAL EXAM:   General: NAD, frail appearing, thin. Sitting up in her wheelchair in common lobby area. Communication limited by very Abanda and no hearing aids in place. Able to read lips to some extent. She is alert and pleasant; able to relate that she is not in pain. Mostly keeping her eyes closed during our visit. Smiles; pleasant affect. Cardiovascular: regular rate and rhythm Pulmonary: clear ant fields, scant bilateral basilar insp crackles Abdomen: soft, nontender, + bowel sounds GU: no suprapubic tenderness Extremities: no edema, no joint deformities Skin: no rashes Neurological: Weakness but otherwise non-focal  Julianne Handler, NP

## 2019-10-08 ENCOUNTER — Encounter: Payer: Self-pay | Admitting: Internal Medicine

## 2019-11-05 ENCOUNTER — Emergency Department (HOSPITAL_COMMUNITY): Payer: Medicare Other

## 2019-11-05 ENCOUNTER — Other Ambulatory Visit: Payer: Self-pay

## 2019-11-05 ENCOUNTER — Encounter (HOSPITAL_COMMUNITY): Payer: Self-pay

## 2019-11-05 ENCOUNTER — Emergency Department (HOSPITAL_COMMUNITY)
Admission: EM | Admit: 2019-11-05 | Discharge: 2019-11-05 | Disposition: A | Discharge status: Skilled Nursing Facility | Payer: Medicare Other | Attending: Emergency Medicine | Admitting: Emergency Medicine

## 2019-11-05 DIAGNOSIS — Y998 Other external cause status: Secondary | ICD-10-CM | POA: Insufficient documentation

## 2019-11-05 DIAGNOSIS — Y9289 Other specified places as the place of occurrence of the external cause: Secondary | ICD-10-CM | POA: Diagnosis not present

## 2019-11-05 DIAGNOSIS — Y9389 Activity, other specified: Secondary | ICD-10-CM | POA: Diagnosis not present

## 2019-11-05 DIAGNOSIS — S0093XA Contusion of unspecified part of head, initial encounter: Secondary | ICD-10-CM | POA: Diagnosis not present

## 2019-11-05 DIAGNOSIS — I129 Hypertensive chronic kidney disease with stage 1 through stage 4 chronic kidney disease, or unspecified chronic kidney disease: Secondary | ICD-10-CM | POA: Insufficient documentation

## 2019-11-05 DIAGNOSIS — S0990XA Unspecified injury of head, initial encounter: Secondary | ICD-10-CM

## 2019-11-05 DIAGNOSIS — N182 Chronic kidney disease, stage 2 (mild): Secondary | ICD-10-CM | POA: Insufficient documentation

## 2019-11-05 DIAGNOSIS — F0390 Unspecified dementia without behavioral disturbance: Secondary | ICD-10-CM | POA: Diagnosis not present

## 2019-11-05 DIAGNOSIS — Z79899 Other long term (current) drug therapy: Secondary | ICD-10-CM | POA: Diagnosis not present

## 2019-11-05 DIAGNOSIS — S51012A Laceration without foreign body of left elbow, initial encounter: Secondary | ICD-10-CM | POA: Diagnosis not present

## 2019-11-05 DIAGNOSIS — W19XXXA Unspecified fall, initial encounter: Secondary | ICD-10-CM | POA: Insufficient documentation

## 2019-11-05 NOTE — ED Provider Notes (Signed)
[facility] DEPT Provider Note  CSN: [id number] Arrival date & time: 11/05/19 0130  Chief Complaint(s) Fall, Head Injury, and Skin Tear  ED Triage Notes Wells Guiles, RN (Registered Nurse) . [facility] Kitchen Nursing . Marland Kitchen Date of Service: 11/05/2019 1:38 AM . . Signed   Per EMS, Pt, from [facility], presents after an unwitnessed fall.  Hematoma noted above R eye and skin tear on L elbow.  Hx of dementia.  Pt is not on blood thinners.       HPI Casey Lam is a 86 y.o. female here for unwitnessed fall at sNF with right head hematoma  HPI  Past Medical History Past Medical History:  Diagnosis Date  . Dementia ([place]) 07/18/2018  . Depression   . Hearing loss 07/18/2018  . Hypertension 07/18/2018   Patient Active Problem List   Diagnosis Date Noted  . Dementia ([place]) 07/18/2018  . Hypertension 07/18/2018  . UTI (urinary tract infection) 07/18/2018  . Acute renal failure superimposed on stage 2 chronic kidney disease ([place]) 07/18/2018  . Fracture of olecranon process of right ulna 07/18/2018  . Fall 07/18/2018   Home Medication(s) Prior to Admission medications   Medication Sig Start Date End Date Taking? Authorizing Provider  acetaminophen (TYLENOL) 325 MG tablet Take 2 tablets (650 mg total) by mouth every 6 (six) hours as needed for mild pain (or Fever >/= 101). 07/22/18   Hosie Poisson, MD  atenolol (TENORMIN) 25 MG tablet Take 25 mg by mouth daily. 05/17/18   [provider]  busPIRone (BUSPAR) 10 MG tablet Take 10 mg by mouth daily at 2 PM.  07/07/19   [provider]  docusate sodium (COLACE) 100 MG capsule Take 1 capsule (100 mg total) by mouth every 12 (twelve) hours. 08/13/19   Corena Herter, PA-C  doxylamine, Sleep, (UNISOM) 25 MG tablet Take 12.5 mg by mouth at bedtime.     [provider]  Ensure Max Protein (ENSURE MAX PROTEIN) LIQD Take 330 mLs (11 oz total) by mouth daily. Patient not taking: Reported on 09/17/2019  07/22/18   Hosie Poisson, MD  feeding supplement, ENSURE ENLIVE, (ENSURE ENLIVE) LIQD Take 237 mLs by mouth 2 (two) times daily between meals. Patient taking differently: Take 237 mLs by mouth in the morning, at noon, and at bedtime.  07/22/18   Hosie Poisson, MD  loperamide (IMODIUM A-D) 2 MG tablet Take 2-4 mg by mouth See admin instructions. Take 2 tablets by mouth for first dose, then 1 tablet after each loose stool as needed.    [provider]  methocarbamol (ROBAXIN) 500 MG tablet Take 1 tablet (500 mg total) by mouth every 8 (eight) hours as needed for muscle spasms. 07/22/18   Hosie Poisson, MD  pantoprazole (PROTONIX) 20 MG tablet Take 20 mg by mouth daily.    [provider]  PARoxetine (PAXIL) 30 MG tablet Take 30 mg by mouth daily at 12 noon.  05/17/18   [provider]  senna-docusate (SENOKOT-S) 8.6-50 MG tablet Take 1 tablet by mouth at bedtime as needed for mild constipation. Patient not taking: Reported on 09/17/2019 07/22/18   Hosie Poisson, MD  Past Surgical History Past Surgical History:  Procedure Laterality Date  . PACEMAKER PLACEMENT     Family History Family History  Problem Relation Age of Onset  . Dementia Sister   . Dementia Brother     Social History Social History   Tobacco Use  . Smoking status: Never Smoker  . Smokeless tobacco: Never Used  Vaping Use  . Vaping Use: Unknown  Substance Use Topics  . Alcohol use: Not Currently  . Drug use: Never   Allergies Lorazepam  Review of Systems Review of Systems  Unable to perform ROS: Dementia    Physical Exam Vital Signs  I have reviewed the triage vital signs BP (!) 161/81   Pulse 85   Temp (!) 97.4 F (36.3 C) (Axillary) Comment: Pt refused oral  Resp 16   SpO2 97% Comment: Simultaneous filing. User may not have seen previous data.  Physical  Exam Vitals reviewed.  Constitutional:      General: She is not in acute distress.    Appearance: She is well-developed. She is not diaphoretic.  HENT:     Head: Normocephalic. Contusion present. No laceration.      Right Ear: External ear normal.     Left Ear: External ear normal.     Nose: Nose normal.  Eyes:     General: No scleral icterus.       Right eye: No discharge.        Left eye: No discharge.     Conjunctiva/sclera: Conjunctivae normal.     Pupils: Pupils are equal, round, and reactive to light.  Neck:     Trachea: Phonation normal.  Cardiovascular:     Rate and Rhythm: Normal rate and regular rhythm.     Pulses:          Radial pulses are 2+ on the right side and 2+ on the left side.       Dorsalis pedis pulses are 2+ on the right side and 2+ on the left side.     Heart sounds: Normal heart sounds. No murmur heard.  No friction rub. No gallop.   Pulmonary:     Effort: Pulmonary effort is normal. No respiratory distress.     Breath sounds: Normal breath sounds. No stridor. No wheezing.  Abdominal:     General: There is no distension.     Palpations: Abdomen is soft.     Tenderness: There is no abdominal tenderness.  Musculoskeletal:        General: No tenderness. Normal range of motion.     Cervical back: Normal range of motion and neck supple. No bony tenderness.     Thoracic back: No bony tenderness.     Lumbar back: No bony tenderness.     Comments: Clavicles stable. Chest stable to AP/Lat compression. Pelvis stable to Lat compression. No obvious extremity deformity. No chest or abdominal wall contusion.  Skin:    General: Skin is warm and dry.     Findings: No erythema or rash.       Neurological:     Mental Status: She is alert.     Comments: Moving all extremities  Psychiatric:        Behavior: Behavior normal.     ED Results and Treatments Labs (all labs ordered are listed, but only abnormal results are displayed) Labs Reviewed - No data  to display  EKG  EKG Interpretation  Date/Time:    Ventricular Rate:    PR Interval:    QRS Duration:   QT Interval:    QTC Calculation:   R Axis:     Text Interpretation:        Radiology CT Head Wo Contrast  Result Date: 11/05/2019 CLINICAL DATA:  Un witnessed fall, right supraorbital hematoma, dimension EXAM: CT HEAD WITHOUT CONTRAST TECHNIQUE: Contiguous axial images were obtained from the base of the skull through the vertex without intravenous contrast. COMPARISON:  09/17/2019 FINDINGS: Brain: No acute infarct or hemorrhage. Scattered hypodensities within the white matter unchanged consistent with chronic small vessel ischemic changes. Lateral ventricles and remaining midline structures are unremarkable. No acute extra-axial fluid collections. No mass effect. Vascular: No hyperdense vessel or unexpected calcification. Skull: There is a right supraorbital scalp hematoma. No underlying fracture. Remainder of the calvarium is unremarkable. Sinuses/Orbits: No acute finding. Other: None. IMPRESSION: 1. Right supraorbital scalp hematoma. 2. No acute intracranial process. Electronically Signed   By: Sharlet Salina M.D.   On: 11/05/2019 02:28    Pertinent labs & imaging results that were available during my care of the patient were reviewed by me and considered in my medical decision making (see chart for details).  Medications Ordered in ED Medications - No data to display                                                                                                                                  Procedures Procedures  (including critical care time)  Medical Decision Making / ED Course I have reviewed the nursing notes for this encounter and the patient's prior records (if available in EHR or on provided paperwork).   Casey Lam was evaluated in Emergency  Department on 11/05/2019 for the symptoms described in the history of present illness. She was evaluated in the context of the global COVID-19 pandemic, which necessitated consideration that the patient might be at risk for infection with the SARS-CoV-2 virus that causes COVID-19. Institutional protocols and algorithms that pertain to the evaluation of patients at risk for COVID-19 are in a state of rapid change based on information released by regulatory bodies including the CDC and federal and state organizations. These policies and algorithms were followed during the patient's care in the ED.  CT head w/o ICH. No other injuries on exam warranting imaging.  Called daughter to update, but no answer.      Final Clinical Impression(s) / ED Diagnoses Final diagnoses:  Fall, initial encounter  Closed head injury, initial encounter    The patient appears reasonably screened and/or stabilized for discharge and I doubt any other medical condition or other [facility] requiring further screening, evaluation, or treatment in the ED at this time prior to discharge. Safe for discharge with strict return precautions.  Disposition: Discharge  Condition: Good  I have discussed the results, Dx and Tx plan with the patient/family  who expressed understanding and agree(s) with the plan. Discharge instructions discussed at length. The patient/family was given strict return precautions who verbalized understanding of the instructions. No further questions at time of discharge.    ED Discharge Orders    None      .     This chart was dictated using voice recognition software.  Despite best efforts to proofread,  errors can occur which can change the documentation meaning.   Nira Conn, MD 11/05/19 [phone]

## 2019-11-05 NOTE — ED Notes (Signed)
[facility] called  

## 2019-11-05 NOTE — ED Notes (Signed)
Pt unable to sign out

## 2019-11-05 NOTE — ED Triage Notes (Signed)
Per EMS, Pt, from [facility], presents after an unwitnessed fall.  Hematoma noted above R eye and skin tear on L elbow.  Hx of dementia.  Pt is not on blood thinners.

## 2020-02-06 ENCOUNTER — Other Ambulatory Visit: Payer: Self-pay

## 2020-02-06 ENCOUNTER — Non-Acute Institutional Stay: Payer: Medicare Other | Admitting: Nurse Practitioner

## 2020-02-06 DIAGNOSIS — Z515 Encounter for palliative care: Secondary | ICD-10-CM

## 2020-02-06 DIAGNOSIS — F0391 Unspecified dementia with behavioral disturbance: Secondary | ICD-10-CM

## 2020-02-06 NOTE — Progress Notes (Signed)
[facility] Consult Note Telephone: [phone]  Fax: [phone]  PATIENT NAME: Casey Lam [place] [address] [phone] (home)  DOB: 11/08/1925 MRN: [id number]  PRIMARY CARE PROVIDER:    Monico Blitz, NP,  [place] [address] [phone]  REFERRING PROVIDER:   Monico Blitz, NP [place],  [place] [address] [phone]  RESPONSIBLE PARTY:   Extended Emergency Contact Information Primary Emergency Contact: Casey Lam. Home Phone: [phone] Mobile Phone: [phone] Relation: Daughter Secondary Emergency Contact: Lemmie Lam Mobile Phone: [phone] Relation: Relative  I met face to face with patient in facility.  ASSESSMENT AND RECOMMENDATIONS:   1. Advance Care Planning/Goals of Care:  Goals of Care: Per prior visit note, patient's goals of care is comfort and for patient to happy. Unable to reach daughter today to review. Directives: Patient's code status is DNR. Signed DNR form and MOST form are on chart in facility and on [facility] EMR. MOST form details include; limited additional interventions, antibiotics if life can be prolonged, IV fluids long term if indicated, no feeding tube.  2. Symptom Management: Patient with advance dementia (FAST 7a).Patient is followed by [facility] for her mental health care, no report of recent GDR or med changes. Patient is hard of hearing, communication impeded by my face mask as patient tend to read lips to augment her hearing. No evidence of acute distress noted during visit, however patient was non- complaint with exam today. Patient voiced no concerns today, nursing endorsed patient stability reporting no concerns. Patient weight is stable, no report of recent falls, last fall was on 11/05/2019. No pain control issues. Palliative care will continue to provide support to patient, family and the medical  team.   3. Follow up Palliative Care Visit: Palliative care will continue to follow for goals of care clarification and symptom management. Return in about 8 weeks or prn.  4. Family /Caregiver/Community Supports: Per prior visit; Patient has 6 children (5 daughters and one son),  daughter Casey Lam is the only child who lives in [place]. Unable to reach Charmaine to discuss visit update, left voice message.  5. 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 Patient also blind in right eye. Patient continues to depend on staff for all her ADLs, but able to feed self finger foods. Wheelchair dependent for ambulation.  I spent 48 minutes providing this consultation, time includes time spent with patient, chart review, and documentation. More than 50% of the time in this consultation was spent coordinating communication.   HISTORY OF PRESENT ILLNESS:  Casey Lam is a 86 y.o. 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 Pacemaker (followed by [facility]/remote PM battery check via device in room. Last check 2020 told has 5-6 years till end of battery life). Palliative Care was asked to follow this patient by consultation request of Monico Blitz, NP to help address advance care planning and goals of care. This is a follow up visit.  CODE STATUS: DNR  PPS: 40%  HOSPICE ELIGIBILITY/DIAGNOSIS: TBD  PAST MEDICAL HISTORY:  Past Medical History:  Diagnosis Date  . Dementia ([place]) 07/18/2018  . Depression   . Hearing loss 07/18/2018  . Hypertension 07/18/2018    SOCIAL HX:  Social History   Tobacco  Use  . Smoking status: Never Smoker  . Smokeless tobacco: Never Used  Substance Use Topics  . Alcohol use: Not Currently   FAMILY HX:  Family History  Problem Relation Age of Onset  .  Dementia Sister   . Dementia Brother     ALLERGIES:  Allergies  Allergen Reactions  . Lorazepam Other (See Comments)     PERTINENT MEDICATIONS:  Outpatient Encounter Medications as of 02/06/2020  Medication Sig  . acetaminophen (TYLENOL) 325 MG tablet Take 2 tablets (650 mg total) by mouth every 6 (six) hours as needed for mild pain (or Fever >/= 101).  Marland Kitchen atenolol (TENORMIN) 25 MG tablet Take 25 mg by mouth daily.  . busPIRone (BUSPAR) 10 MG tablet Take 10 mg by mouth daily at 2 PM.   . docusate sodium (COLACE) 100 MG capsule Take 1 capsule (100 mg total) by mouth every 12 (twelve) hours.  Marland Kitchen doxylamine, Sleep, (UNISOM) 25 MG tablet Take 12.5 mg by mouth at bedtime.   . Ensure Max Protein (ENSURE MAX PROTEIN) LIQD Take 330 mLs (11 oz total) by mouth daily. (Patient not taking: Reported on 09/17/2019)  . feeding supplement, ENSURE ENLIVE, (ENSURE ENLIVE) LIQD Take 237 mLs by mouth 2 (two) times daily between meals. (Patient taking differently: Take 237 mLs by mouth in the morning, at noon, and at bedtime. )  . loperamide (IMODIUM A-D) 2 MG tablet Take 2-4 mg by mouth See admin instructions. Take 2 tablets by mouth for first dose, then 1 tablet after each loose stool as needed.  . methocarbamol (ROBAXIN) 500 MG tablet Take 1 tablet (500 mg total) by mouth every 8 (eight) hours as needed for muscle spasms.  . pantoprazole (PROTONIX) 20 MG tablet Take 20 mg by mouth daily.  Marland Kitchen PARoxetine (PAXIL) 30 MG tablet Take 30 mg by mouth daily at 12 noon.   . senna-docusate (SENOKOT-S) 8.6-50 MG tablet Take 1 tablet by mouth at bedtime as needed for mild constipation. (Patient not taking: Reported on 09/17/2019)   No facility-administered encounter medications on file as of 02/06/2020.    PHYSICAL EXAM / ROS:   Current and past weights: stable at 111.3lbs General: NAD, frail appearing, thin, sitting in dinning area finishing lunch Cardiovascular: no chest pain reported, no edema  Pulmonary: no cough, no  increased SOB, room air Abdomen: appetite fair, denied constipation, incontinent of bowel GU: no report of dysuria, incontinent of urine MSK:  no joint and ROM abnormalities, ambulatory Skin: no rashes or wounds reported Neurological: Weakness, but otherwise nonfocal    Jari Favre, DNP, AGPCNP-BC

## 2020-05-03 ENCOUNTER — Non-Acute Institutional Stay: Payer: Medicare Other | Admitting: Nurse Practitioner

## 2020-05-03 ENCOUNTER — Other Ambulatory Visit: Payer: Self-pay

## 2020-05-03 DIAGNOSIS — Z515 Encounter for palliative care: Secondary | ICD-10-CM

## 2020-05-03 DIAGNOSIS — G309 Alzheimer's disease, unspecified: Secondary | ICD-10-CM

## 2020-05-03 DIAGNOSIS — F0281 Dementia in other diseases classified elsewhere with behavioral disturbance: Secondary | ICD-10-CM

## 2020-05-03 NOTE — Progress Notes (Signed)
[facility] Consult Note Telephone: [phone]  Fax: [phone]  PATIENT NAME: Casey Lam [place] [address] [phone] (home)  DOB: 06/28/1925 MRN: [id number]  PRIMARY CARE PROVIDER:    Monico Blitz, NP,  [place] [address] [phone]  REFERRING PROVIDER:   Monico Blitz, NP [place],  [place] [address] [phone]  RESPONSIBLE PARTY:   Extended Emergency Contact Information Primary Emergency Contact: Gwynneth Munson. Home Phone: [phone] Mobile Phone: [phone] Relation: Daughter Secondary Emergency Contact: Lemmie Evens Mobile Phone: [phone] Relation: Relative  I met face to face with patient in facility.   ASSESSMENT AND RECOMMENDATIONS:   Advance Care Planning: Goal of care: Patient's goals of care is comfort and for patient to happy.  Directives: Patient's code status is DNR. Signed DNR form and MOST form are on chart in facility and on [facility] EMR. MOST form details include; limited additional interventions, antibiotics if life can be prolonged, IV fluids long term if indicated, no feeding tube.  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 Patient also blind in right eye. Patient depends on staff for all her ADLs, able to feed self finger foods. Wheelchair dependent for ambulation. Patient had a 4lbs weight gain since last palliative care visit 3 months ago. Patient uncooperative with exam today, asked to be left alone. Staff voiced no concerns for patient, report no change in patient's condition, report good apetite. Patient received flu and Booster Covid-19 vaccines without report of any issues. Family voiced no concerns for patient today, report they last visited 5 days ago. Family verbalized appreciation for the visit and call.   Follow up Palliative Care Visit:  Palliative care will continue to follow for goals of care clarification and symptom management. Return in about 4-6 weeks or prn.  Family /Caregiver/Community Supports: Patient is a resident of Memory Care unit, daughter Mitchell Heir is very involved in her care.  I spent 48 minutes providing this consultation, time includes time spent with patient and on phone with family, chart review, provider coordination, and documentation. More than 50% of the time in this consultation was spent counseling and coordinating communication.   CHIEF COMPLAINT: Follow up palliative care visit  History obtained from review of EMR, discussion with primary team, and  interview with family, and caregiver. Records reviewed and summarized bellow.  HISTORY OF PRESENT ILLNESS:  Casey Lam is a 86 y.o. 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 Pacemaker (followed by [facility] [place]/remote PM battery check via device in room. Last check 2020 told has 5-6 years till end of battery life). Palliative Care was asked to help address advance care planning and goals of care. This is a follow up visit from 02/06/2020.  CODE STATUS: DNR  PPS: 40%  HOSPICE ELIGIBILITY/DIAGNOSIS: TBD  PHYSICAL EXAM / ROS:   Current and past weights: 115.1lbs up from 111.3lbs at last visit 3 months ago,  General: NAD, frail appearing, lying in bed in NAD Cardiovascular: no chest pain reported, no edema  Pulmonary: no acute cough, no SOB, room air Abdomen: appetite fair, no report of constipation, incontinent of bowel GU: no report of dysuria, incontinent of urine MSK:  no joint and ROM abnormalities, ambulatory Skin: no rashes or  wounds reported Neurological: Weakness, confused Psych: non -anxious affect  PAST MEDICAL HISTORY:  Past Medical History:  Diagnosis Date    Dementia ([place]) 07/18/2018   Depression    Hearing loss 07/18/2018   Hypertension 07/18/2018    SOCIAL HX:  Social History   Tobacco Use   Smoking status: Never Smoker   Smokeless tobacco: Never Used  Substance Use Topics   Alcohol use: Not Currently   FAMILY HX:  Family History  Problem Relation Age of Onset   Dementia Sister    Dementia Brother     ALLERGIES:  Allergies  Allergen Reactions   Lorazepam Other (See Comments)     PERTINENT MEDICATIONS:  Outpatient Encounter Medications as of 05/03/2020  Medication Sig   acetaminophen (TYLENOL) 325 MG tablet Take 2 tablets (650 mg total) by mouth every 6 (six) hours as needed for mild pain (or Fever >/= 101).   atenolol (TENORMIN) 25 MG tablet Take 25 mg by mouth daily.   busPIRone (BUSPAR) 10 MG tablet Take 10 mg by mouth daily at 2 PM.    docusate sodium (COLACE) 100 MG capsule Take 1 capsule (100 mg total) by mouth every 12 (twelve) hours.   doxylamine, Sleep, (UNISOM) 25 MG tablet Take 12.5 mg by mouth at bedtime.    Ensure Max Protein (ENSURE MAX PROTEIN) LIQD Take 330 mLs (11 oz total) by mouth daily. (Patient not taking: Reported on 09/17/2019)   feeding supplement, ENSURE ENLIVE, (ENSURE ENLIVE) LIQD Take 237 mLs by mouth 2 (two) times daily between meals. (Patient taking differently: Take 237 mLs by mouth in the morning, at noon, and at bedtime. )   loperamide (IMODIUM A-D) 2 MG tablet Take 2-4 mg by mouth See admin instructions. Take 2 tablets by mouth for first dose, then 1 tablet after each loose stool as needed.   methocarbamol (ROBAXIN) 500 MG tablet Take 1 tablet (500 mg total) by mouth every 8 (eight) hours as needed for muscle spasms.   pantoprazole (PROTONIX) 20 MG tablet Take 20 mg by mouth daily.   PARoxetine (PAXIL) 30 MG tablet Take 30 mg by mouth daily at 12 noon.    senna-docusate (SENOKOT-S) 8.6-50 MG tablet Take 1 tablet by mouth at bedtime as needed for mild constipation. (Patient  not taking: Reported on 09/17/2019)   No facility-administered encounter medications on file as of 05/03/2020.    Thank you for the opportunity to participate in the care of Ms. George Hugh. The palliative care team will continue to follow. Please call our office at [phone] if we can be of additional assistance.   Jari Favre, DNP, AGPCNP-BC

## 2020-10-29 ENCOUNTER — Other Ambulatory Visit: Payer: Self-pay

## 2020-10-29 ENCOUNTER — Other Ambulatory Visit: Payer: Medicare Other | Admitting: Nurse Practitioner

## 2020-10-29 DIAGNOSIS — Z515 Encounter for palliative care: Secondary | ICD-10-CM

## 2020-10-29 DIAGNOSIS — F0390 Unspecified dementia without behavioral disturbance: Secondary | ICD-10-CM

## 2020-10-29 NOTE — Progress Notes (Signed)
[facility] Pall[facility] Consult Note Telephone: [phone]  Fax: [phone]    Date of encounter: 10/29/20 PATIENT NAME: Casey Lam [id number]   [phone] (home)  DOB: 1925-06-22 MRN: [id number]  PRIMARY CARE PROVIDER:    Monico Blitz, NP,  [place] [address] [phone]  REFERRING PROVIDER:   Monico Blitz, NP [address] [place],   [address] [phone]  RESPONSIBLE PARTY:    Contact Information     Name Relation Home Work [address]   Gwynneth Munson Daughter [phone]  [phone]   Lemmie Evens Relative   [phone]     I met face to face with patient in home. Palliative Care was asked to follow this patient by consultation request of  Monico Blitz, NP to address advance care planning and complex medical decision making. This is a follow up visit.                                   ASSESSMENT AND PLAN / RECOMMENDATIONS:   Advance Care Planning/Goals of Care:  CODE STATUS: DNR Goal of care: Patient's goals of care is comfort. Directives: Signed DNR and MOST forms present on chart in facility and on [facility] EMR. Details of the MOST form include; limited additional interventions, antibiotics if life can be prolonged, IV fluids long term if indicated, no feeding tube. Unable to reach family to review ACP and goals of care.  Symptom Management/Plan: Dementia: Stable. No report of uncontrolled behavior. Recent GDR of Buspirone from 25m to 546mdaily, staff report tolerance of new dose. No report of coughing or spluttering during meals, no weight loss. Continue supportive care. Discussion on disease trajectory of dementia with staff, as it is progressive and terminal and likely to eventually lead to dysphagia, weight loss and immobility.   Follow up Palliative Care Visit: Palliative care will continue to follow for complex medical decision making, advance  care planning, and clarification of goals. Return as needed.  PPS: 30%  HOSPICE ELIGIBILITY/DIAGNOSIS: TBD  CHIEF COMPLAIN: follow up palliative care visit  History obtained from review of Epic EMR, patient chart, discussion with facility staff, and Casey Lam.   HISTORY OF PRESENT ILLNESS:  Casey Lam a [age].o. 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 [place] depend of staff for all her ADLs,she is however able to feed self finger food. She is wheelchair dependent for ambulation. Anxiety related to major depression is controlled on Buspirone, no report of uncontrolled behavior concerns or pain. No report of fever, chills, SOB, or chest pain. Limited ROS due to poor cognitions due to advanced Dementia.  I reviewed available labs, medications, imaging, studies and related documents from the EMR.  Records reviewed and summarized above.   Physical Exam: Current and past weights: 126.6lb up from 113.3lbs six month ago General: frail appearing, thin, sitting in wheel chair in common area in NAD EYES: anicteric sclera, no discharge  ENMT: oral mucous membranes moist, dentition intact CV: S1S2 normal, no LE edema Pulmonary: LCTA, no increased work of breathing, no cough, room air Abdomen: no ascites GU: deferred MSK: severe sarcopenia, moves all extremities, non-ambulatory Skin: warm  and dry Neuro: generalized weakness, severe cognitive impairment Psych: non-anxious affect, Alert Hem/lymph/immuno: no widespread bruising  Past Medical History:  Diagnosis Date   Dementia ([place]) 07/18/2018   Depression    Hearing loss 07/18/2018   Hypertension 07/18/2018    I spent 48 minutes providing this consultation, time includes time spent with patient and  on phone with family, chart review, provider coordination, and documentation. More than 50% of the time in this consultation was spent in counseling and care coordination.  Thank you for the opportunity to participate in the care of Casey Lam.  The palliative care team will continue to follow. Please call our office at [phone] if we can be of additional assistance.   Jari Favre, DNP, AGPCNP-BC  COVID-19 PATIENT SCREENING TOOL Asked and negative response unless otherwise noted:   Have you had symptoms of covid, tested positive or been in contact with someone with symptoms/positive test in the past 5-10 days?

## 2021-01-22 ENCOUNTER — Emergency Department (HOSPITAL_COMMUNITY)
Admission: EM | Admit: 2021-01-22 | Discharge: 2021-01-23 | Disposition: A | Discharge status: Home / Self Care | Payer: Medicare Other | Attending: Emergency Medicine | Admitting: Emergency Medicine

## 2021-01-22 ENCOUNTER — Other Ambulatory Visit: Payer: Self-pay

## 2021-01-22 ENCOUNTER — Encounter (HOSPITAL_COMMUNITY): Payer: Self-pay

## 2021-01-22 ENCOUNTER — Emergency Department (HOSPITAL_COMMUNITY): Payer: Medicare Other

## 2021-01-22 DIAGNOSIS — T17320A Food in larynx causing asphyxiation, initial encounter: Secondary | ICD-10-CM

## 2021-01-22 DIAGNOSIS — X58XXXA Exposure to other specified factors, initial encounter: Secondary | ICD-10-CM | POA: Diagnosis not present

## 2021-01-22 DIAGNOSIS — T17300A Unspecified foreign body in larynx causing asphyxiation, initial encounter: Secondary | ICD-10-CM | POA: Insufficient documentation

## 2021-01-22 DIAGNOSIS — F0390 Unspecified dementia without behavioral disturbance: Secondary | ICD-10-CM | POA: Insufficient documentation

## 2021-01-22 DIAGNOSIS — Z950 Presence of cardiac pacemaker: Secondary | ICD-10-CM | POA: Diagnosis not present

## 2021-01-22 DIAGNOSIS — Z79899 Other long term (current) drug therapy: Secondary | ICD-10-CM | POA: Insufficient documentation

## 2021-01-22 DIAGNOSIS — R0989 Other specified symptoms and signs involving the circulatory and respiratory systems: Secondary | ICD-10-CM | POA: Diagnosis present

## 2021-01-22 DIAGNOSIS — I10 Essential (primary) hypertension: Secondary | ICD-10-CM | POA: Diagnosis not present

## 2021-01-22 NOTE — ED Notes (Signed)
[facility] called for transport back to [facility].  

## 2021-01-22 NOTE — ED Notes (Signed)
Pt able to tolerate PO fluids without issues. MD Rhunette Croft made aware.

## 2021-01-22 NOTE — Discharge Instructions (Addendum)
Casey Lam had a normal appearing Xrays and has tolerated fluid here without difficulty. Monitor her closely when she has food intake tomorrow - consider soft diet for one day before advancing to normal food.

## 2021-01-22 NOTE — ED Triage Notes (Signed)
Pt BIB [facility] from [facility]. Patient had an obstruction while eating and began choking. When EMS arrived obstruction was cleared. EMS reports clear lung sounds on auscultation. Patient has a hx of dementia and is nonverbal/nonambulatory.   118/84 80-HR 14-RR 95% RA

## 2021-01-22 NOTE — ED Provider Notes (Signed)
[facility]-EMERGENCY DEPT Provider Note   CSN: [id number] Arrival date & time: 01/22/21  1811     History Chief Complaint  Patient presents with   Choking    Casey Lam is a 87 y.o. female.  HPI     Level 5 caveat for advanced dementia.  Per Rashida, SNF RN, patient started choking and gagging while having dinner.  She was having chicken.She started turning blue and having mucus coming out of her nose. No emesis. Episode lasted for 4 minutes. No previous hx of similar episode.  When EMS arrived, patient was back to being baseline normal.  Patient has no complaints from her side.  Past Medical History:  Diagnosis Date   Dementia ([facility]) 07/18/2018   Depression    Hearing loss 07/18/2018   Hypertension 07/18/2018    Patient Active Problem List   Diagnosis Date Noted   Dementia (HCC) 07/18/2018   Hypertension 07/18/2018   UTI (urinary tract infection) 07/18/2018   Acute renal failure superimposed on stage 2 chronic kidney disease (HCC) 07/18/2018   Fracture of olecranon process of right ulna 07/18/2018   Fall 07/18/2018    Past Surgical History:  Procedure Laterality Date   PACEMAKER PLACEMENT       OB History   No obstetric history on file.     Family History  Problem Relation Age of Onset   Dementia Sister    Dementia Brother     Social History   Tobacco Use   Smoking status: Never   Smokeless tobacco: Never  Vaping Use   Vaping Use: Unknown  Substance Use Topics   Alcohol use: Not Currently   Drug use: Never    Home Medications Prior to Admission medications   Medication Sig Start Date End Date Taking? Authorizing Provider  acetaminophen (TYLENOL) 325 MG tablet Take 2 tablets (650 mg total) by mouth every 6 (six) hours as needed for mild pain (or Fever >/= 101). 07/22/18   Kathlen Mody, MD  atenolol (TENORMIN) 25 MG tablet Take 25 mg by mouth daily. 05/17/18   [provider]  busPIRone (BUSPAR) 10 MG tablet  Take 10 mg by mouth daily at 2 PM.  07/07/19   [provider]  docusate sodium (COLACE) 100 MG capsule Take 1 capsule (100 mg total) by mouth every 12 (twelve) hours. 08/13/19   Lorelee New, PA-C  doxylamine, Sleep, (UNISOM) 25 MG tablet Take 12.5 mg by mouth at bedtime.     [provider]  Ensure Max Protein (ENSURE MAX PROTEIN) LIQD Take 330 mLs (11 oz total) by mouth daily. Patient not taking: Reported on 09/17/2019 07/22/18   Kathlen Mody, MD  feeding supplement, ENSURE ENLIVE, (ENSURE ENLIVE) LIQD Take 237 mLs by mouth 2 (two) times daily between meals. Patient taking differently: Take 237 mLs by mouth in the morning, at noon, and at bedtime.  07/22/18   Kathlen Mody, MD  loperamide (IMODIUM A-D) 2 MG tablet Take 2-4 mg by mouth See admin instructions. Take 2 tablets by mouth for first dose, then 1 tablet after each loose stool as needed.    [provider]  methocarbamol (ROBAXIN) 500 MG tablet Take 1 tablet (500 mg total) by mouth every 8 (eight) hours as needed for muscle spasms. 07/22/18   Kathlen Mody, MD  pantoprazole (PROTONIX) 20 MG tablet Take 20 mg by mouth daily.    [provider]  PARoxetine (PAXIL) 30 MG tablet Take 30 mg by mouth daily at 12  noon.  05/17/18   [provider]  senna-docusate (SENOKOT-S) 8.6-50 MG tablet Take 1 tablet by mouth at bedtime as needed for mild constipation. Patient not taking: Reported on 09/17/2019 07/22/18   Kathlen Mody, MD    Allergies    Lorazepam  Review of Systems   Review of Systems  Unable to perform ROS: Dementia   Physical Exam Updated Vital Signs BP 125/72   Pulse 79   Temp 97.7 F (36.5 C) (Axillary)   Resp 17   Ht 5\' 7"  (1.702 m)   Wt 80 kg   SpO2 94%   BMI 27.62 kg/m   Physical Exam Vitals and nursing note reviewed.  Constitutional:      Appearance: She is well-developed.  Cardiovascular:     Rate and Rhythm: Normal rate.  Pulmonary:     Effort: Pulmonary effort is  normal.     Breath sounds: No wheezing or rhonchi.  Musculoskeletal:     Cervical back: Normal range of motion and neck supple.  Neurological:     Mental Status: She is alert. Mental status is at baseline. She is disoriented.    ED Results / Procedures / Treatments   Labs (all labs ordered are listed, but only abnormal results are displayed) Labs Reviewed - No data to display  EKG None  Radiology DG Chest [facility] 1 View  Result Date: 01/22/2021 CLINICAL DATA:  Evaluation for aspiration. EXAM: PORTABLE CHEST 1 VIEW.  Patient is markedly rotated. COMPARISON:  Chest x-ray 09/17/2019, CT chest 08/19/2019 FINDINGS: Similar-appearing 2 lead pacemaker in stable position. The heart and mediastinal contours are unchanged. Aortic calcification. Hiatal hernia. Similar appearance of the right lung compared to chest x-ray 09/17/2019. Question retrocardiac airspace opacity. No pulmonary edema. No pleural effusion. No pneumothorax. No acute osseous abnormality. IMPRESSION: 1. Question retrocardiac airspace opacity. With limited evaluation due to low lung volumes and patient positioning/rotation. 2. Hiatal hernia. Electronically Signed   By: 11/17/2019 M.D.   On: 01/22/2021 20:01    Procedures Procedures   Medications Ordered in ED Medications - No data to display  ED Course  I have reviewed the triage vital signs and the nursing notes.  Pertinent labs & imaging results that were available during my care of the patient were reviewed by me and considered in my medical decision making (see chart for details).  Clinical Course as of 01/22/21 2131  Tue Jan 22, 2021  2131 Called patient's family, it went to voicemail review did not leave a message.  She is stable for discharge.  She has passed oral challenge.  X-ray is not showing any concerning findings and there has not been any hypoxic episodes here [AN]    Clinical Course User Index [AN] Jan 24, 2021, MD   MDM Rules/Calculators/A&P                             87 year old comes in after having a choking spell at her nursing home.  She has advanced dementia and was unable to participate in the history.  However, she is appearing comfortable with no respiratory distress, stridor.  Spoke with the nursing facility and patient was in extremis at 1 point and cyanotic.  She never became unresponsive.  We will initiate gentle p.o. challenge here and get x-ray to ensure there is no aspiration pneumonitis.  Final Clinical Impression(s) / ED Diagnoses Final diagnoses:  Choking due to food in larynx, initial encounter  Rx / DC Orders ED Discharge Orders     None        Derwood Kaplan, MD 01/22/21 2131

## 2021-01-24 ENCOUNTER — Emergency Department (HOSPITAL_COMMUNITY): Payer: Medicare Other

## 2021-01-24 ENCOUNTER — Encounter (HOSPITAL_COMMUNITY): Payer: Self-pay | Admitting: Emergency Medicine

## 2021-01-24 ENCOUNTER — Other Ambulatory Visit: Payer: Self-pay

## 2021-01-24 ENCOUNTER — Inpatient Hospital Stay (HOSPITAL_COMMUNITY)
Admission: EM | Admit: 2021-01-24 | Discharge: 2021-01-27 | DRG: 392 | Disposition: A | Discharge status: Skilled Nursing Facility | Payer: Medicare Other | Source: Skilled Nursing Facility | Attending: Internal Medicine | Admitting: Internal Medicine

## 2021-01-24 DIAGNOSIS — F419 Anxiety disorder, unspecified: Secondary | ICD-10-CM | POA: Diagnosis present

## 2021-01-24 DIAGNOSIS — K449 Diaphragmatic hernia without obstruction or gangrene: Secondary | ICD-10-CM | POA: Diagnosis not present

## 2021-01-24 DIAGNOSIS — I700 Atherosclerosis of aorta: Secondary | ICD-10-CM | POA: Diagnosis present

## 2021-01-24 DIAGNOSIS — E049 Nontoxic goiter, unspecified: Secondary | ICD-10-CM | POA: Diagnosis present

## 2021-01-24 DIAGNOSIS — Z20822 Contact with and (suspected) exposure to covid-19: Secondary | ICD-10-CM | POA: Diagnosis present

## 2021-01-24 DIAGNOSIS — T17908A Unspecified foreign body in respiratory tract, part unspecified causing other injury, initial encounter: Secondary | ICD-10-CM | POA: Diagnosis not present

## 2021-01-24 DIAGNOSIS — F418 Other specified anxiety disorders: Secondary | ICD-10-CM | POA: Diagnosis present

## 2021-01-24 DIAGNOSIS — R0600 Dyspnea, unspecified: Secondary | ICD-10-CM | POA: Diagnosis present

## 2021-01-24 DIAGNOSIS — R739 Hyperglycemia, unspecified: Secondary | ICD-10-CM | POA: Diagnosis present

## 2021-01-24 DIAGNOSIS — E441 Mild protein-calorie malnutrition: Secondary | ICD-10-CM | POA: Diagnosis present

## 2021-01-24 DIAGNOSIS — R1310 Dysphagia, unspecified: Secondary | ICD-10-CM | POA: Diagnosis present

## 2021-01-24 DIAGNOSIS — S52021A Displaced fracture of olecranon process without intraarticular extension of right ulna, initial encounter for closed fracture: Secondary | ICD-10-CM | POA: Diagnosis present

## 2021-01-24 DIAGNOSIS — Z79899 Other long term (current) drug therapy: Secondary | ICD-10-CM

## 2021-01-24 DIAGNOSIS — F32A Depression, unspecified: Secondary | ICD-10-CM | POA: Diagnosis present

## 2021-01-24 DIAGNOSIS — F0390 Unspecified dementia without behavioral disturbance: Secondary | ICD-10-CM | POA: Diagnosis present

## 2021-01-24 DIAGNOSIS — Z888 Allergy status to other drugs, medicaments and biological substances status: Secondary | ICD-10-CM

## 2021-01-24 DIAGNOSIS — I10 Essential (primary) hypertension: Secondary | ICD-10-CM | POA: Diagnosis present

## 2021-01-24 DIAGNOSIS — Z950 Presence of cardiac pacemaker: Secondary | ICD-10-CM

## 2021-01-24 DIAGNOSIS — H9190 Unspecified hearing loss, unspecified ear: Secondary | ICD-10-CM | POA: Diagnosis present

## 2021-01-24 DIAGNOSIS — Z66 Do not resuscitate: Secondary | ICD-10-CM | POA: Diagnosis present

## 2021-01-24 DIAGNOSIS — K458 Other specified abdominal hernia without obstruction or gangrene: Secondary | ICD-10-CM | POA: Diagnosis present

## 2021-01-24 LAB — COMPREHENSIVE METABOLIC PANEL
ALT: 14 U/L (ref 0–44)
AST: 25 U/L (ref 15–41)
Albumin: 3.4 g/dL — ABNORMAL LOW (ref 3.5–5.0)
Alkaline Phosphatase: 91 U/L (ref 38–126)
Anion gap: 8 (ref 5–15)
BUN: 23 mg/dL (ref 8–23)
CO2: 27 mmol/L (ref 22–32)
Calcium: 8.8 mg/dL — ABNORMAL LOW (ref 8.9–10.3)
Chloride: 105 mmol/L (ref 98–111)
Creatinine, Ser: 0.79 mg/dL (ref 0.44–1.00)
GFR, Estimated: >60 mL/min (ref >60)
Glucose, Bld: 150 mg/dL — ABNORMAL HIGH (ref 70–99)
Potassium: 4.2 mmol/L (ref 3.5–5.1)
Sodium: 140 mmol/L (ref 135–145)
Total Bilirubin: 0.5 mg/dL (ref 0.3–1.2)
Total Protein: 6.8 g/dL (ref 6.5–8.1)

## 2021-01-24 LAB — CBC WITH DIFFERENTIAL/PLATELET
Abs Immature Granulocytes: 0.03 10*3/uL (ref 0.00–0.07)
Basophils Absolute: 0.1 10*3/uL (ref 0.0–0.1)
Basophils Relative: 1 %
Eosinophils Absolute: 0.6 10*3/uL — ABNORMAL HIGH (ref 0.0–0.5)
Eosinophils Relative: 7 %
HCT: 43 % (ref 36.0–46.0)
Hemoglobin: 13.6 g/dL (ref 12.0–15.0)
Immature Granulocytes: 0 %
Lymphocytes Relative: 21 %
Lymphs Abs: 1.8 10*3/uL (ref 0.7–4.0)
MCH: 31 pg (ref 26.0–34.0)
MCHC: 31.6 g/dL (ref 30.0–36.0)
MCV: 97.9 fL (ref 80.0–100.0)
Monocytes Absolute: 0.6 10*3/uL (ref 0.1–1.0)
Monocytes Relative: 7 %
Neutro Abs: 5.5 10*3/uL (ref 1.7–7.7)
Neutrophils Relative %: 64 %
Platelets: 227 10*3/uL (ref 150–400)
RBC: 4.39 MIL/uL (ref 3.87–5.11)
RDW: 13.9 % (ref 11.5–15.5)
WBC: 8.6 10*3/uL (ref 4.0–10.5)
nRBC: 0 % (ref 0.0–0.2)

## 2021-01-24 LAB — I-STAT CHEM 8, ED
BUN: 21 mg/dL (ref 8–23)
Calcium, Ion: 1.22 mmol/L (ref 1.15–1.40)
Chloride: 105 mmol/L (ref 98–111)
Creatinine, Ser: 0.8 mg/dL (ref 0.44–1.00)
Glucose, Bld: 147 mg/dL — ABNORMAL HIGH (ref 70–99)
HCT: 38 % (ref 36.0–46.0)
Hemoglobin: 12.9 g/dL (ref 12.0–15.0)
Potassium: 3.8 mmol/L (ref 3.5–5.1)
Sodium: 141 mmol/L (ref 135–145)
TCO2: 29 mmol/L (ref 22–32)

## 2021-01-24 LAB — RESP PANEL BY RT-PCR (FLU A&B, COVID) ARPGX2
Influenza A by PCR: NEGATIVE
Influenza B by PCR: NEGATIVE
SARS Coronavirus 2 by RT PCR: NEGATIVE

## 2021-01-24 MED ORDER — SODIUM CHLORIDE 0.9 % IV BOLUS
500.0000 mL | Freq: Once | INTRAVENOUS | Status: AC
  Administered 2021-01-24: 500 mL via INTRAVENOUS

## 2021-01-24 MED ORDER — IOHEXOL 350 MG/ML SOLN
80.0000 mL | Freq: Once | INTRAVENOUS | Status: AC | PRN
  Administered 2021-01-24: 80 mL via INTRAVENOUS

## 2021-01-24 MED ORDER — LIDOCAINE VISCOUS HCL 2 % MT SOLN
15.0000 mL | Freq: Once | OROMUCOSAL | Status: AC
  Administered 2021-01-24: 15 mL via ORAL
  Filled 2021-01-24: qty 15

## 2021-01-24 MED ORDER — FAMOTIDINE IN NACL 20-0.9 MG/50ML-% IV SOLN
20.0000 mg | Freq: Once | INTRAVENOUS | Status: AC
  Administered 2021-01-24: 20 mg via INTRAVENOUS
  Filled 2021-01-24: qty 50

## 2021-01-24 MED ORDER — ONDANSETRON HCL 4 MG/2ML IJ SOLN: 4.0000 mg | Freq: Four times a day (QID) | INTRAMUSCULAR | Status: DC | PRN

## 2021-01-24 MED ORDER — DIAZEPAM 5 MG/ML IJ SOLN
2.5000 mg | Freq: Once | INTRAMUSCULAR | Status: AC
  Administered 2021-01-24: 2.5 mg via INTRAVENOUS
  Filled 2021-01-24: qty 2

## 2021-01-24 MED ORDER — PANTOPRAZOLE SODIUM 40 MG IV SOLR
40.0000 mg | Freq: Once | INTRAVENOUS | Status: AC
  Administered 2021-01-24: 40 mg via INTRAVENOUS
  Filled 2021-01-24: qty 40

## 2021-01-24 MED ORDER — ACETAMINOPHEN 650 MG RE SUPP: 650.0000 mg | Freq: Four times a day (QID) | RECTAL | Status: DC | PRN

## 2021-01-24 MED ORDER — LACTATED RINGERS IV SOLN: INTRAVENOUS | Status: DC

## 2021-01-24 MED ORDER — ENOXAPARIN SODIUM 40 MG/0.4ML IJ SOSY
40.0000 mg | PREFILLED_SYRINGE | INTRAMUSCULAR | Status: DC
  Administered 2021-01-26: 40 mg via SUBCUTANEOUS
  Filled 2021-01-24 (×2): qty 0.4

## 2021-01-24 MED ORDER — HALOPERIDOL LACTATE 5 MG/ML IJ SOLN
2.0000 mg | Freq: Once | INTRAMUSCULAR | Status: AC
  Administered 2021-01-24: 2 mg via INTRAVENOUS
  Filled 2021-01-24: qty 1

## 2021-01-24 MED ORDER — ALUM & MAG HYDROXIDE-SIMETH 200-200-20 MG/5ML PO SUSP
30.0000 mL | Freq: Once | ORAL | Status: AC
  Administered 2021-01-24: 30 mL via ORAL
  Filled 2021-01-24: qty 30

## 2021-01-24 MED ORDER — ACETAMINOPHEN 325 MG PO TABS
650.0000 mg | ORAL_TABLET | Freq: Four times a day (QID) | ORAL | Status: DC | PRN
  Administered 2021-01-26: 650 mg via ORAL
  Filled 2021-01-24: qty 2

## 2021-01-24 MED ORDER — ONDANSETRON HCL 4 MG PO TABS: 4.0000 mg | ORAL_TABLET | Freq: Four times a day (QID) | ORAL | Status: DC | PRN

## 2021-01-24 NOTE — ED Provider Notes (Signed)
[facility]-EMERGENCY DEPT Provider Note   CSN: [id number] Arrival date & time: 01/24/21  1837     History Chief Complaint  Patient presents with   Shortness of Breath    Casey Lam is a 87 y.o. female hx HTN, here presenting with possible aspiration.  Patient was seen here 2 days ago and apparently ate some food and possibly aspirated.  She had unremarkable chest x-ray and was able to tolerate p.o. afterwards.  She is she then went back to the nursing home.  She apparently was eating dinner and then had mucus coming out of her mouth and had some shortness of breath.  Patient is very demented and unable to give much history.   The history is provided by the EMS personnel.  Level V caveat- dementia     Past Medical History:  Diagnosis Date   Dementia (HCC) 07/18/2018   Depression    Hearing loss 07/18/2018   Hypertension 07/18/2018    Patient Active Problem List   Diagnosis Date Noted   Dementia (HCC) 07/18/2018   Hypertension 07/18/2018   UTI (urinary tract infection) 07/18/2018   Acute renal failure superimposed on stage 2 chronic kidney disease (HCC) 07/18/2018   Fracture of olecranon process of right ulna 07/18/2018   Fall 07/18/2018    Past Surgical History:  Procedure Laterality Date   PACEMAKER PLACEMENT       OB History   No obstetric history on file.     Family History  Problem Relation Age of Onset   Dementia Sister    Dementia Brother     Social History   Tobacco Use   Smoking status: Never   Smokeless tobacco: Never  Vaping Use   Vaping Use: Unknown  Substance Use Topics   Alcohol use: Not Currently   Drug use: Never    Home Medications Prior to Admission medications   Medication Sig Start Date End Date Taking? Authorizing Provider  acetaminophen (TYLENOL) 325 MG tablet Take 2 tablets (650 mg total) by mouth every 6 (six) hours as needed for mild pain (or Fever >/= 101). 07/22/18   Kathlen Mody, MD  atenolol  (TENORMIN) 25 MG tablet Take 25 mg by mouth daily. 05/17/18   [provider]  busPIRone (BUSPAR) 10 MG tablet Take 10 mg by mouth daily at 2 PM.  07/07/19   [provider]  docusate sodium (COLACE) 100 MG capsule Take 1 capsule (100 mg total) by mouth every 12 (twelve) hours. 08/13/19   Lorelee New, PA-C  doxylamine, Sleep, (UNISOM) 25 MG tablet Take 12.5 mg by mouth at bedtime.     [provider]  Ensure Max Protein (ENSURE MAX PROTEIN) LIQD Take 330 mLs (11 oz total) by mouth daily. Patient not taking: Reported on 09/17/2019 07/22/18   Kathlen Mody, MD  feeding supplement, ENSURE ENLIVE, (ENSURE ENLIVE) LIQD Take 237 mLs by mouth 2 (two) times daily between meals. Patient taking differently: Take 237 mLs by mouth in the morning, at noon, and at bedtime.  07/22/18   Kathlen Mody, MD  loperamide (IMODIUM A-D) 2 MG tablet Take 2-4 mg by mouth See admin instructions. Take 2 tablets by mouth for first dose, then 1 tablet after each loose stool as needed.    [provider]  methocarbamol (ROBAXIN) 500 MG tablet Take 1 tablet (500 mg total) by mouth every 8 (eight) hours as needed for muscle spasms. 07/22/18   Kathlen Mody, MD  pantoprazole (PROTONIX) 20 MG tablet  Take 20 mg by mouth daily.    [provider]  PARoxetine (PAXIL) 30 MG tablet Take 30 mg by mouth daily at 12 noon.  05/17/18   [provider]  senna-docusate (SENOKOT-S) 8.6-50 MG tablet Take 1 tablet by mouth at bedtime as needed for mild constipation. Patient not taking: Reported on 09/17/2019 07/22/18   Kathlen Mody, MD    Allergies    Lorazepam  Review of Systems   Review of Systems  Respiratory:  Positive for shortness of breath.   All other systems reviewed and are negative.  Physical Exam Updated Vital Signs BP 127/67   Pulse 80   Temp (!) 97.4 F (36.3 C) (Oral)   Resp 14   SpO2 96%   Physical Exam Vitals and nursing note reviewed.  Constitutional:      Comments:  Chronically ill and agitated  HENT:     Head: Normocephalic.  Eyes:     Pupils: Pupils are equal, round, and reactive to light.  Cardiovascular:     Rate and Rhythm: Normal rate and regular rhythm.  Pulmonary:     Effort: Pulmonary effort is normal.     Comments: Diminished bilateral bases Musculoskeletal:        General: Normal range of motion.     Cervical back: Normal range of motion and neck supple.  Skin:    General: Skin is warm.     Capillary Refill: Capillary refill takes less than 2 seconds.  Neurological:     Comments: Demented and unable to give much history  Psychiatric:        Mood and Affect: Mood normal.    ED Results / Procedures / Treatments   Labs (all labs ordered are listed, but only abnormal results are displayed) Labs Reviewed  CBC WITH DIFFERENTIAL/PLATELET - Abnormal; Notable for the following components:      Result Value   Eosinophils Absolute 0.6 (*)    All other components within normal limits  COMPREHENSIVE METABOLIC PANEL - Abnormal; Notable for the following components:   Glucose, Bld 150 (*)    Calcium 8.8 (*)    Albumin 3.4 (*)    All other components within normal limits  I-STAT CHEM 8, ED - Abnormal; Notable for the following components:   Glucose, Bld 147 (*)    All other components within normal limits  RESP PANEL BY RT-PCR (FLU A&B, COVID) [id number]    EKG None  Radiology CT Angio Chest PE W and/or Wo Contrast  Result Date: 01/24/2021 CLINICAL DATA:  Shortness of breath. EXAM: CT ANGIOGRAPHY CHEST WITH CONTRAST TECHNIQUE: Multidetector CT imaging of the chest was performed using the standard protocol during bolus administration of intravenous contrast. Multiplanar CT image reconstructions and MIPs were obtained to evaluate the vascular anatomy. CONTRAST:  3mL OMNIPAQUE IOHEXOL 350 MG/ML SOLN COMPARISON:  August 19, 2019 FINDINGS: Cardiovascular: There is a multi lead AICD. Moderate severity calcification of the aortic arch and marked  severity calcification of the descending thoracic aorta is noted. Satisfactory opacification of the pulmonary arteries to the segmental level. No evidence of pulmonary embolism. Normal heart size with moderate severity coronary artery calcification. No pericardial effusion. Mediastinum/Nodes: No enlarged mediastinal, hilar, or axillary lymph nodes. The right and left lobes of the thyroid gland are markedly enlarged, with inferior extension of the right lobe of the thyroid gland into the substernal region. This is stable in appearance when compared to the prior study. Subsequent deviation of the trachea is noted. The esophagus demonstrate  no significant findings. Lungs/Pleura: Mild atelectatic changes are seen within the bilateral lower lobes. This is slightly more prominent along the posterior aspects of the bilateral lung bases. There is no evidence of a pleural effusion or pneumothorax. Upper Abdomen: Very large, stable gastric hernia is seen. Musculoskeletal: Degenerative changes are noted throughout the thoracic spine. Review of the MIP images confirms the above findings. IMPRESSION: 1. No evidence of pulmonary embolus. 2. Mild bilateral lower lobe atelectatic changes. 3. Very large, stable gastric hernia. 4. Stable substernal goiter. In the setting of significant comorbidities or limited life expectancy, no follow-up recommended (ref: J Am Coll Radiol. 2015 Feb;12(2): 143-50). 5. Aortic atherosclerosis. Aortic Atherosclerosis (ICD10-I70.0). Electronically Signed   By: Aram Candela M.D.   On: 01/24/2021 22:08   DG Chest Port 1 View  Result Date: 01/24/2021 CLINICAL DATA:  Difficulty breathing. EXAM: PORTABLE CHEST 1 VIEW COMPARISON:  January 22, 2021 FINDINGS: A multi lead AICD is noted. Decreased lung volumes are seen without evidence of acute infiltrate, pleural effusion or pneumothorax. The cardiac silhouette is borderline in size and unchanged in appearance. Chronic right-sided rib fractures are  noted. Degenerative changes seen throughout the thoracic spine. IMPRESSION: Shallow inspiration without evidence of acute or active cardiopulmonary disease. Electronically Signed   By: Aram Candela M.D.   On: 01/24/2021 20:13    Procedures Procedures   Medications Ordered in ED Medications  famotidine (PEPCID) IVPB 20 mg premix (has no administration in time range)  pantoprazole (PROTONIX) injection 40 mg (has no administration in time range)  diazepam (VALIUM) injection 2.5 mg (2.5 mg Intravenous Given 01/24/21 1943)  iohexol (OMNIPAQUE) 350 MG/ML injection 80 mL (80 mLs Intravenous Contrast Given 01/24/21 2044)  alum & mag hydroxide-simeth (MAALOX/MYLANTA) 200-200-20 MG/5ML suspension 30 mL (30 mLs Oral Given 01/24/21 2134)    And  lidocaine (XYLOCAINE) 2 % viscous mouth solution 15 mL (15 mLs Oral Given 01/24/21 2134)  diazepam (VALIUM) injection 2.5 mg (2.5 mg Intravenous Given 01/24/21 2131)  haloperidol lactate (HALDOL) injection 2 mg (2 mg Intravenous Given 01/24/21 2135)    ED Course  I have reviewed the triage vital signs and the nursing notes.  Pertinent labs & imaging results that were available during my care of the patient were reviewed by me and considered in my medical decision making (see chart for details).    MDM Rules/Calculators/A&P                           Casey Lam is a 87 y.o. female here presenting with possible choking episode.  This is a recurrent issue.  Patient came 2 days ago for similar symptoms and was able to tolerate p.o. while she was in the ED. I tried to give patient GI cocktail but she was unable to tolerated it.  Patient is very demented.  I wonder if she aspirated.  At this point we will repeat a chest x-ray and will likely need a CT chest  10:26 PM CT showed large hiatal hernia that is chronic.  She patient required sedation to get CT.  She is very sleepy now.  Given that she was unable to tolerate p.o. I think she will benefit from admission  for hydration.  She is DNR but wants IV fluids per recent palliative care note.  At this point I think she can be brought in for hydration and palliative care should see patient to identify goals of care and possibly get swallow eval and  discuss G tube.   Final Clinical Impression(s) / ED Diagnoses Final diagnoses:  None    Rx / DC Orders ED Discharge Orders     None        Charlynne Pander, MD 01/24/21 2242

## 2021-01-24 NOTE — H&P (Signed)
History and Physical    Casey Lam GGY:[id number] DOB: [birth year] DOA: 01/24/2021  PCP: Angelica Pou, NP   Patient coming from: [facility].  I have personally briefly reviewed patient's old medical records in [facility]  Chief Complaint: Shortness of breath.  HPI: Casey Lam is a 87 y.o. female with medical history significant of dementia, depression, anxiety, hearing loss, hypertension, nonverbal at baseline who is brought to the emergency department via EMS due to developing dyspnea after eating dinner at the facility.  She has dysphagia at baseline and is unable to provide further history at this time.  ED Course: Initial vital signs were temperature 97.4 F, pulse 65, respiration 14, BP 142/67 mmHg and O2 sat 97% on room air.  The patient received 30 mL of Maalox, 15 mL of viscous Xylocaine, diazepam 2.5 mg IVP twice, Haldol 2 mg IVP x1, a 500 mL NS bolus and 40 mg of pantoprazole IVP.  Lab work: CBC was normal with a white count 8.6, hemoglobin 13.6 g/dL platelets 035.  CMP showed a glucose of 150 mg/dL and albumin of 3.4 g/dL.  The rest of the CMP results are unremarkable when calcium is corrected to albumin.  Imaging: A portable 1 view chest radiograph shows shallow inspiration without evidence of acute or active cardiopulmonary disease.  CTA chest showed no PE.  Mild bilateral lower lobe atelectatic changes.  Very large gastric hernia.  Stable substernal goiter and aortic atherosclerosis.  Please see images and full radiology report for further details.  Review of Systems: As per HPI otherwise all other systems reviewed and are negative.  Past Medical History:  Diagnosis Date   Dementia (HCC) 07/18/2018   Depression    Hearing loss 07/18/2018   Hypertension 07/18/2018   Past Surgical History:  Procedure Laterality Date   PACEMAKER PLACEMENT     Social History  reports that she has never smoked. She has never used smokeless tobacco. She reports that she does  not currently use alcohol. She reports that she does not use drugs.  Allergies  Allergen Reactions   Lorazepam Other (See Comments)   Family History  Problem Relation Age of Onset   Dementia Sister    Dementia Brother    Prior to Admission medications   Medication Sig Start Date End Date Taking? Authorizing Provider  acetaminophen (TYLENOL) 325 MG tablet Take 2 tablets (650 mg total) by mouth every 6 (six) hours as needed for mild pain (or Fever >/= 101). 07/22/18   Kathlen Mody, MD  atenolol (TENORMIN) 25 MG tablet Take 25 mg by mouth daily. 05/17/18   [provider]  busPIRone (BUSPAR) 10 MG tablet Take 10 mg by mouth daily at 2 PM.  07/07/19   [provider]  docusate sodium (COLACE) 100 MG capsule Take 1 capsule (100 mg total) by mouth every 12 (twelve) hours. 08/13/19   Lorelee New, PA-C  doxylamine, Sleep, (UNISOM) 25 MG tablet Take 12.5 mg by mouth at bedtime.     [provider]  Ensure Max Protein (ENSURE MAX PROTEIN) LIQD Take 330 mLs (11 oz total) by mouth daily. Patient not taking: Reported on 09/17/2019 07/22/18   Kathlen Mody, MD  feeding supplement, ENSURE ENLIVE, (ENSURE ENLIVE) LIQD Take 237 mLs by mouth 2 (two) times daily between meals. Patient taking differently: Take 237 mLs by mouth in the morning, at noon, and at bedtime.  07/22/18   Kathlen Mody, MD  loperamide (IMODIUM A-D) 2 MG tablet Take 2-4 mg by  mouth See admin instructions. Take 2 tablets by mouth for first dose, then 1 tablet after each loose stool as needed.    [provider]  methocarbamol (ROBAXIN) 500 MG tablet Take 1 tablet (500 mg total) by mouth every 8 (eight) hours as needed for muscle spasms. 07/22/18   Kathlen Mody, MD  pantoprazole (PROTONIX) 20 MG tablet Take 20 mg by mouth daily.    [provider]  PARoxetine (PAXIL) 30 MG tablet Take 30 mg by mouth daily at 12 noon.  05/17/18   [provider]  senna-docusate (SENOKOT-S) 8.6-50 MG tablet  Take 1 tablet by mouth at bedtime as needed for mild constipation. Patient not taking: Reported on 09/17/2019 07/22/18   Kathlen Mody, MD    Physical Exam: Vitals:   01/24/21 2035 01/24/21 2100 01/24/21 2130 01/24/21 2157  BP: 105/65 124/69 129/60 127/67  Pulse: 70 70 73 80  Resp: 18  (!) 22 14  Temp:      TempSrc:      SpO2: 95% 97% 95% 96%   Constitutional: Sedated.  NAD, calm, comfortable Eyes: PERRL, lids and conjunctivae normal ENMT: Mucous membranes are mildly dry.  Posterior pharynx clear of any exudate or lesions. Neck: normal, supple, no masses, no thyromegaly Respiratory: clear to auscultation bilaterally, no wheezing, no crackles. Normal respiratory effort. No accessory muscle use.  Cardiovascular: Regular rate and rhythm, no murmurs / rubs / gallops. No extremity edema. 2+ pedal pulses. No carotid bruits.  Abdomen: No distention.  Bowel sounds positive.  Soft, no tenderness, no masses palpated. No hepatosplenomegaly.  Musculoskeletal: Mild to moderate generalized weakness.  No clubbing / cyanosis. Good ROM, no contractures. Normal muscle tone.  Skin: no acute rashes, lesions, ulcers on very limited dermatological examination. Neurologic: Grossly nonfocal on limited neurological exam. Psychiatric: Somnolent due to sedation.  Woke up briefly with tactile stimuli.  Nonverbal.  No acute distress.  Labs on Admission: I have personally reviewed following labs and imaging studies  CBC: Recent Labs  Lab 01/24/21 1933 01/24/21 1944  WBC 8.6  --   NEUTROABS 5.5  --   HGB 13.6 12.9  HCT 43.0 38.0  MCV 97.9  --   PLT 227  --     Basic Metabolic Panel: Recent Labs  Lab 01/24/21 1933 01/24/21 1944  NA 140 141  K 4.2 3.8  CL 105 105  CO2 27  --   GLUCOSE 150* 147*  BUN 23 21  CREATININE 0.79 0.80  CALCIUM 8.8*  --     GFR: Estimated Creatinine Clearance: 48.9 mL/min (by C-G formula based on SCr of 0.8 mg/dL).  Liver Function Tests: Recent Labs  Lab  01/24/21 1933  AST 25  ALT 14  ALKPHOS 91  BILITOT 0.5  PROT 6.8  ALBUMIN 3.4*   Radiological Exams on Admission: CT Angio Chest PE W and/or Wo Contrast  Result Date: 01/24/2021 CLINICAL DATA:  Shortness of breath. EXAM: CT ANGIOGRAPHY CHEST WITH CONTRAST TECHNIQUE: Multidetector CT imaging of the chest was performed using the standard protocol during bolus administration of intravenous contrast. Multiplanar CT image reconstructions and MIPs were obtained to evaluate the vascular anatomy. CONTRAST:  37mL OMNIPAQUE IOHEXOL 350 MG/ML SOLN COMPARISON:  August 19, 2019 FINDINGS: Cardiovascular: There is a multi lead AICD. Moderate severity calcification of the aortic arch and marked severity calcification of the descending thoracic aorta is noted. Satisfactory opacification of the pulmonary arteries to the segmental level. No evidence of pulmonary embolism. Normal heart size with moderate severity  coronary artery calcification. No pericardial effusion. Mediastinum/Nodes: No enlarged mediastinal, hilar, or axillary lymph nodes. The right and left lobes of the thyroid gland are markedly enlarged, with inferior extension of the right lobe of the thyroid gland into the substernal region. This is stable in appearance when compared to the prior study. Subsequent deviation of the trachea is noted. The esophagus demonstrate no significant findings. Lungs/Pleura: Mild atelectatic changes are seen within the bilateral lower lobes. This is slightly more prominent along the posterior aspects of the bilateral lung bases. There is no evidence of a pleural effusion or pneumothorax. Upper Abdomen: Very large, stable gastric hernia is seen. Musculoskeletal: Degenerative changes are noted throughout the thoracic spine. Review of the MIP images confirms the above findings. IMPRESSION: 1. No evidence of pulmonary embolus. 2. Mild bilateral lower lobe atelectatic changes. 3. Very large, stable gastric hernia. 4. Stable substernal  goiter. In the setting of significant comorbidities or limited life expectancy, no follow-up recommended (ref: J Am Coll Radiol. 2015 Feb;12(2): 143-50). 5. Aortic atherosclerosis. Aortic Atherosclerosis (ICD10-I70.0). Electronically Signed   By: Aram Candela M.D.   On: 01/24/2021 22:08   DG Chest Port 1 View  Result Date: 01/24/2021 CLINICAL DATA:  Difficulty breathing. EXAM: PORTABLE CHEST 1 VIEW COMPARISON:  January 22, 2021 FINDINGS: A multi lead AICD is noted. Decreased lung volumes are seen without evidence of acute infiltrate, pleural effusion or pneumothorax. The cardiac silhouette is borderline in size and unchanged in appearance. Chronic right-sided rib fractures are noted. Degenerative changes seen throughout the thoracic spine. IMPRESSION: Shallow inspiration without evidence of acute or active cardiopulmonary disease. Electronically Signed   By: Aram Candela M.D.   On: 01/24/2021 20:13    EKG: Independently reviewed.  Vent. rate 81 BPM PR interval 199 ms QRS duration 96 ms QT/QTcB 378/439 ms P-R-T axes 8 -39 59 Sinus rhythm Left axis deviation Low voltage, precordial leads Abnormal R-wave progression, early transition Consider anterior infarct  Assessment/Plan Principal Problem:   Aspiration into airway Observation/telemetry. Keep n.p.o. Aspiration precautions Consult SLP in a.m.  Active Problems:   Anxiety with depression Hold paroxetine 30 mg p.o. daily. Hold buspirone 10 mg p.o. in the afternoon.    Dementia (HCC) Anxiolytics IV as needed. On anxiety and depression treatment.    Hypertension Switch to IV metoprolol while n.p.o. Monitor blood pressure and heart rate.    Hyperglycemia Nonfasting level. Check fasting CBG.    Mild protein malnutrition (HCC) Continue protein supplementation once cleared for oral intake. Nutritional services evaluation if this issue worsens.    DVT prophylaxis: Lovenox SQ. Code Status:   DNR. Family  Communication:   Disposition Plan:   Patient is from:  [facility].  Anticipated DC to:  [facility].  Anticipated DC date:  01/25/2021.  Anticipated DC barriers: Clinical status.  Consults called:  SLP evaluation in AM. Admission status:  Observation/MedSurg.  Severity of Illness:  High severity after coming to the emergency department via EMS for an aspiration event.  The patient will remain in the hospital for monitoring tonight and further work-up in the morning.  Bobette Mo MD [facility]  How to contact the [facility] Attending or Consulting provider 7A - 7P or covering provider during after hours 7P -7A, for this patient?   Check the care team in [facility] and look for a) attending/consulting TRH provider listed and b) the [facility] team listed Log into [email or web address] and use [facility] universal password to access. If you do not have the password,  please contact the hospital operator. Locate the [facility] provider you are looking for under [facility] and page to a number that you can be directly reached. If you still have difficulty reaching the provider, please page the [facility] (Director on Call) for the Hospitalists listed on amion for assistance.  01/24/2021, 10:59 PM   This document was prepared using Dragon voice recognition software and may contain some unintended transcription errors.

## 2021-01-24 NOTE — ED Triage Notes (Signed)
BIBA Per EMS: Pt coming from [facility] with c/o difficulty breathing after eating dinner per the facility. Per facility, pt had mucus coming out of her mouth. Per EMS, pt lung sounds clear bilaterally; no mucus production, no difficulty breathing. Vitals WDL. Hx dementia and dysphasia. Pt is nonverbal.

## 2021-01-25 DIAGNOSIS — F418 Other specified anxiety disorders: Secondary | ICD-10-CM | POA: Diagnosis not present

## 2021-01-25 DIAGNOSIS — T17908A Unspecified foreign body in respiratory tract, part unspecified causing other injury, initial encounter: Secondary | ICD-10-CM | POA: Diagnosis not present

## 2021-01-25 DIAGNOSIS — F0391 Unspecified dementia with behavioral disturbance: Secondary | ICD-10-CM

## 2021-01-25 DIAGNOSIS — I10 Essential (primary) hypertension: Secondary | ICD-10-CM

## 2021-01-25 DIAGNOSIS — R739 Hyperglycemia, unspecified: Secondary | ICD-10-CM | POA: Diagnosis not present

## 2021-01-25 MED ORDER — PANTOPRAZOLE SODIUM 20 MG PO TBEC
20.0000 mg | DELAYED_RELEASE_TABLET | Freq: Every day | ORAL | Status: DC
  Administered 2021-01-26: 20 mg via ORAL
  Filled 2021-01-25 (×2): qty 1

## 2021-01-25 MED ORDER — ATENOLOL 25 MG PO TABS
25.0000 mg | ORAL_TABLET | Freq: Every day | ORAL | Status: DC
  Administered 2021-01-26: 25 mg via ORAL
  Filled 2021-01-25 (×2): qty 1

## 2021-01-25 MED ORDER — ENSURE ENLIVE PO LIQD
237.0000 mL | Freq: Three times a day (TID) | ORAL | Status: DC
  Administered 2021-01-26 (×2): 237 mL via ORAL

## 2021-01-25 MED ORDER — PAROXETINE HCL 20 MG PO TABS
30.0000 mg | ORAL_TABLET | Freq: Every day | ORAL | Status: DC
  Administered 2021-01-26: 30 mg via ORAL
  Filled 2021-01-25 (×2): qty 1

## 2021-01-25 MED ORDER — METOPROLOL TARTRATE 5 MG/5ML IV SOLN: 5.0000 mg | Freq: Two times a day (BID) | INTRAVENOUS | Status: DC

## 2021-01-25 MED ORDER — METOPROLOL TARTRATE 5 MG/5ML IV SOLN: 2.5000 mg | Freq: Two times a day (BID) | INTRAVENOUS | Status: DC

## 2021-01-25 MED ORDER — BUSPIRONE HCL 5 MG PO TABS
10.0000 mg | ORAL_TABLET | ORAL | Status: DC
  Administered 2021-01-26 (×3): 10 mg via ORAL
  Filled 2021-01-25 (×4): qty 2

## 2021-01-25 NOTE — ED Notes (Signed)
Speech at the bedside.

## 2021-01-25 NOTE — ED Notes (Signed)
Pt placed on purewick 

## 2021-01-25 NOTE — ED Notes (Signed)
Pt resting quietly with eyes closed. Normal chest rise. Attached to cardiac monitor x3. VSS.  

## 2021-01-25 NOTE — Progress Notes (Signed)
PROGRESS NOTE    Casey Lam  HQI:[id number] DOB: July 09, 1925 DOA: 01/24/2021 PCP: Angelica Pou, NP   Brief Narrative: Casey Lam is a 87 y.o. female with a history of dementia, depression, dysphagia, anxiety, hypertension. Patient presented secondary to shortness of breath after eating dinner concerning for aspiration. While in the ED, patient was hostile/combative and she was given diazepam IV/Haldol IV. She was admitted for dysphagia workup. Speech therapy was consulted and recommend a dysphagia 1 diet. GI was curbsided and recommended no intervention as her hiatal hernia was the likely cause of dysphagia and not amenable to surgical repair secondary to poor candidacy. Palliative care consulted.   Assessment & Plan:   Principal Problem:   Aspiration into airway Active Problems:   Dementia (HCC)   Hypertension   Anxiety with depression   Hyperglycemia   Mild protein malnutrition (HCC)   Shortness of breath Aspiration Speech therapy consulted and evaluated patient. Recommendations for dysphagia 1 diet, however, concern for possible esophageal impairment related to hiatal hernia. Discussed case with GI who agrees that her hiatal hernia is the likely cause for aspiration, unfortunately there are no intervention options for the patient at her age (surgical repair would be required). Attempted to discuss with family but no answer. -Palliative care consult -Dysphagia 1 diet -Watch for development of pneumonia symptoms -Resume home Protonix  Anxiety Depression Patient is on Buspar and Paxil as an outpatient. Currently held secondary to NPO status. -Resume Buspar and Paxil once able to have oral intake  Dementia Per history, patient is non-verbal -Delirium precautions -Avoid long acting benzodiazepine  Primary hypertension -Restart home atenolol  Hyperglycemia Noted.  Mild malnutrition Complicated by age and comorbidities, including dementia. -Dietitian  consult   DVT prophylaxis: Lovenox Code Status:   Code Status: DNR Family Communication: None at bedside. Called daughter but no response Disposition Plan: Discharge pending improvement of mental status, goals of care discussions, PT/OT recommendations   Consultants:  [facility] (curbside) Palliative care medicine  Procedures:  None  Antimicrobials: None    Subjective: Patient does not respond to my questions this morning although per history she is non-verbal at baseline  Objective: Vitals:   01/25/21 1200 01/25/21 1400 01/25/21 1500 01/25/21 1545  BP: 123/62 (!) 118/53 (!) 126/52 (!) 146/68  Pulse: 60 64 63 61  Resp: 14 15 17    Temp:      TempSrc:      SpO2: 99% 100% 100%    No intake or output data in the 24 hours ending 01/25/21 1553 There were no vitals filed for this visit.  Examination:  General exam: Appears calm and comfortable HEENT: pupils equal/reactive Respiratory system: Clear to auscultation. Respiratory effort normal. Cardiovascular system: S1 & S2 heard, RRR. Gastrointestinal system: Abdomen is nondistended, soft and nontender. No organomegaly or masses felt. Normal bowel sounds heard. Central nervous system: Lethargic but opens eyes to tactile stimuli.  Musculoskeletal: No edema. No calf tenderness Skin: No cyanosis. No rashes    Data Reviewed: I have personally reviewed following labs and imaging studies  CBC Lab Results  Component Value Date   WBC 8.6 01/24/2021   RBC 4.39 01/24/2021   HGB 12.9 01/24/2021   HCT 38.0 01/24/2021   MCV 97.9 01/24/2021   MCH 31.0 01/24/2021   PLT 227 01/24/2021   MCHC 31.6 01/24/2021   RDW 13.9 01/24/2021   LYMPHSABS 1.8 01/24/2021   MONOABS 0.6 01/24/2021   EOSABS 0.6 (H) 01/24/2021   BASOSABS 0.1 01/24/2021  Last metabolic panel Lab Results  Component Value Date   NA 141 01/24/2021   K 3.8 01/24/2021   CL 105 01/24/2021   CO2 27 01/24/2021   BUN 21 01/24/2021   CREATININE  0.80 01/24/2021   GLUCOSE 147 (H) 01/24/2021   GFRNONAA >60 01/24/2021   GFRAA >60 09/17/2019   CALCIUM 8.8 (L) 01/24/2021   PROT 6.8 01/24/2021   ALBUMIN 3.4 (L) 01/24/2021   BILITOT 0.5 01/24/2021   ALKPHOS 91 01/24/2021   AST 25 01/24/2021   ALT 14 01/24/2021   ANIONGAP 8 01/24/2021    CBG (last 3)  No results for input(s): GLUCAP in the last 72 hours.   GFR: Estimated Creatinine Clearance: 48.9 mL/min (by C-G formula based on SCr of 0.8 mg/dL).  Coagulation Profile: No results for input(s): INR, PROTIME in the last 168 hours.  Recent Results (from the past 240 hour(s))  Resp Panel by RT-PCR (Flu A&B, Covid) Nasopharyngeal Swab     Status: None   Collection Time: 01/24/21  7:33 PM   Specimen: Nasopharyngeal Swab; Nasopharyngeal(NP) swabs in vial transport medium  Result Value Ref Range Status   SARS Coronavirus 2 by RT PCR NEGATIVE NEGATIVE Final    Comment: (NOTE) SARS-CoV-2 target nucleic acids are NOT DETECTED.  The SARS-CoV-2 RNA is generally detectable in upper respiratory specimens during the acute phase of infection. The lowest concentration of SARS-CoV-2 viral copies this assay can detect is 138 copies/mL. A negative result does not preclude SARS-Cov-2 infection and should not be used as the sole basis for treatment or other patient management decisions. A negative result may occur with  improper specimen collection/handling, submission of specimen other than nasopharyngeal swab, presence of viral mutation(s) within the areas targeted by this assay, and inadequate number of viral copies(<138 copies/mL). A negative result must be combined with clinical observations, patient history, and epidemiological information. The expected result is Negative.  Fact Sheet for Patients:  BloggerCourse.com  Fact Sheet for Healthcare Providers:  SeriousBroker.it  This test is no t yet approved or cleared by the [place] FDA and  has been authorized for detection and/or diagnosis of SARS-CoV-2 by FDA under an Emergency Use Authorization (EUA). This EUA will remain  in effect (meaning this test can be used) for the duration of the COVID-19 declaration under Section 564(b)(1) of the Act, 21 U.S.C.section 360bbb-3(b)(1), unless the authorization is terminated  or revoked sooner.       Influenza A by PCR NEGATIVE NEGATIVE Final   Influenza B by PCR NEGATIVE NEGATIVE Final    Comment: (NOTE) The Xpert Xpress SARS-CoV-2/FLU/RSV plus assay is intended as an aid in the diagnosis of influenza from Nasopharyngeal swab specimens and should not be used as a sole basis for treatment. Nasal washings and aspirates are unacceptable for Xpert Xpress SARS-CoV-2/FLU/RSV testing.  Fact Sheet for Patients: BloggerCourse.com  Fact Sheet for Healthcare Providers: SeriousBroker.it  This test is not yet approved or cleared by the [place] FDA and has been authorized for detection and/or diagnosis of SARS-CoV-2 by FDA under an Emergency Use Authorization (EUA). This EUA will remain in effect (meaning this test can be used) for the duration of the COVID-19 declaration under Section 564(b)(1) of the Act, 21 U.S.C. section 360bbb-3(b)(1), unless the authorization is terminated or revoked.  Performed at [facility], [address], [place], [place] [address]         Radiology Studies: CT Angio Chest PE W and/or Wo Contrast  Result Date:  01/24/2021 CLINICAL DATA:  Shortness of breath. EXAM: CT ANGIOGRAPHY CHEST WITH CONTRAST TECHNIQUE: Multidetector CT imaging of the chest was performed using the standard protocol during bolus administration of intravenous contrast. Multiplanar CT image reconstructions and MIPs were obtained to evaluate the vascular anatomy. CONTRAST:  40mL OMNIPAQUE IOHEXOL 350 MG/ML SOLN COMPARISON:  August 19, 2019  FINDINGS: Cardiovascular: There is a multi lead AICD. Moderate severity calcification of the aortic arch and marked severity calcification of the descending thoracic aorta is noted. Satisfactory opacification of the pulmonary arteries to the segmental level. No evidence of pulmonary embolism. Normal heart size with moderate severity coronary artery calcification. No pericardial effusion. Mediastinum/Nodes: No enlarged mediastinal, hilar, or axillary lymph nodes. The right and left lobes of the thyroid gland are markedly enlarged, with inferior extension of the right lobe of the thyroid gland into the substernal region. This is stable in appearance when compared to the prior study. Subsequent deviation of the trachea is noted. The esophagus demonstrate no significant findings. Lungs/Pleura: Mild atelectatic changes are seen within the bilateral lower lobes. This is slightly more prominent along the posterior aspects of the bilateral lung bases. There is no evidence of a pleural effusion or pneumothorax. Upper Abdomen: Very large, stable gastric hernia is seen. Musculoskeletal: Degenerative changes are noted throughout the thoracic spine. Review of the MIP images confirms the above findings. IMPRESSION: 1. No evidence of pulmonary embolus. 2. Mild bilateral lower lobe atelectatic changes. 3. Very large, stable gastric hernia. 4. Stable substernal goiter. In the setting of significant comorbidities or limited life expectancy, no follow-up recommended (ref: J Am Coll Radiol. 2015 Feb;12(2): 143-50). 5. Aortic atherosclerosis. Aortic Atherosclerosis (ICD10-I70.0). Electronically Signed   By: Aram Candela M.D.   On: 01/24/2021 22:08   DG Chest Port 1 View  Result Date: 01/24/2021 CLINICAL DATA:  Difficulty breathing. EXAM: PORTABLE CHEST 1 VIEW COMPARISON:  January 22, 2021 FINDINGS: A multi lead AICD is noted. Decreased lung volumes are seen without evidence of acute infiltrate, pleural effusion or  pneumothorax. The cardiac silhouette is borderline in size and unchanged in appearance. Chronic right-sided rib fractures are noted. Degenerative changes seen throughout the thoracic spine. IMPRESSION: Shallow inspiration without evidence of acute or active cardiopulmonary disease. Electronically Signed   By: Aram Candela M.D.   On: 01/24/2021 20:13        Scheduled Meds:  enoxaparin (LOVENOX) injection  40 mg Subcutaneous Q24H   metoprolol tartrate  2.5 mg Intravenous Q12H   Continuous Infusions:  lactated ringers       LOS: 0 days     Jacquelin Hawking, MD [facility] 01/25/2021, 3:53 PM  If 7PM-7AM, please contact night-coverage [email or web address]

## 2021-01-25 NOTE — ED Notes (Signed)
Gave bedside report to [place], [place]

## 2021-01-25 NOTE — ED Notes (Signed)
Report sent to floor nurse.  

## 2021-01-25 NOTE — Evaluation (Addendum)
Clinical/Bedside Swallow Evaluation Patient Details  Name: Casey Lam MRN: [id number] Date of Birth: Apr 14, 1926  Today's Date: 01/25/2021 Time: SLP Start Time (ACUTE ONLY): 0940 SLP Stop Time (ACUTE ONLY): 0955 SLP Time Calculation (min) (ACUTE ONLY): 15 min  Past Medical History:  Past Medical History:  Diagnosis Date   Dementia (HCC) 07/18/2018   Depression    Hearing loss 07/18/2018   Hypertension 07/18/2018   Past Surgical History:  Past Surgical History:  Procedure Laterality Date   PACEMAKER PLACEMENT     HPI:  Casey Lam is a 87 y.o. female with medical history significant of dementia, depression, anxiety, hearing loss, hypertension, nonverbal at baseline who is brought to the emergency department via EMS due to developing dyspnea after eating dinner at the facility.  Reportedly had mucous coming from her mouth. No further notes, but reportedly has large hiatal hernia.    Assessment / Plan / Recommendation  Clinical Impression  Pt wakes up easily, but is defensive and needs gentle cueing, initially tried to bite therapist. She accepted a few bites of ice chips, sips of water and bites of puree and 1/2 a graham cracker. No oral dysphagia or signs of aspiration observed. She did not speak or follow commands. She had a slight gurgle/sound of air movement after swallows, suspected mild esophageal backflow though unable to determine subjectively. Given finding of large hiatal hernia, suspect an esophageal impairment. When discussing with RN after eval she reported GI may see pt today. Will keep pt NPO and defer to MD management of diet but suggest pureed Dys 1 diet with thin liquids be initaited when pt ready for PO given concern for esophageal impactions and difficulty with solids. Meds to be crushed.  SLP Visit Diagnosis: Dysphagia, oropharyngeal phase (R13.12)    Aspiration Risk  Risk for inadequate nutrition/hydration;Moderate aspiration risk    Diet Recommendation NPO  (Puree/thin when medically ready)   Medication Administration: Crushed with puree Supervision: Staff to assist with self feeding    Other  Recommendations      Recommendations for follow up therapy are one component of a multi-disciplinary discharge planning process, led by the attending physician.  Recommendations may be updated based on patient status, additional functional criteria and insurance authorization.  Follow up Recommendations Skilled Nursing facility      Frequency and Duration min 2x/week  2 weeks       Prognosis        Swallow Study   General HPI: Casey Lam is a 87 y.o. female with medical history significant of dementia, depression, anxiety, hearing loss, hypertension, nonverbal at baseline who is brought to the emergency department via EMS due to developing dyspnea after eating dinner at the facility.  Reportedly had mucous coming from her mouth. No further notes, but reportedly has large hiatal hernia. Type of Study: Bedside Swallow Evaluation Diet Prior to this Study: NPO Temperature Spikes Noted: No History of Recent Intubation: No Behavior/Cognition: Alert;Doesn't follow directions;Confused Oral Care Completed by SLP: No Oral Cavity - Dentition: Adequate natural dentition Self-Feeding Abilities: Needs assist Patient Positioning: Upright in bed Baseline Vocal Quality: Not observed Volitional Cough: Cognitively unable to elicit Volitional Swallow: Unable to elicit    Oral/Motor/Sensory Function Overall Oral Motor/Sensory Function: Other (comment) (doesnt follow commands but no sign of weakness.)   Ice Chips Ice chips: Within functional limits Presentation: Spoon   Thin Liquid Thin Liquid: Within functional limits Presentation: Straw;Cup    Nectar Thick Nectar Thick Liquid: Not tested   Honey Thick Honey  Thick Liquid: Not tested   Puree Puree: Within functional limits Presentation: Spoon   Solid     Solid: Within functional limits       Casey Lam, Casey Lam 01/25/2021,10:04 AM

## 2021-01-25 NOTE — ED Notes (Addendum)
Addendum: note entered in error.

## 2021-01-26 DIAGNOSIS — H9190 Unspecified hearing loss, unspecified ear: Secondary | ICD-10-CM | POA: Diagnosis present

## 2021-01-26 DIAGNOSIS — R739 Hyperglycemia, unspecified: Secondary | ICD-10-CM | POA: Diagnosis present

## 2021-01-26 DIAGNOSIS — R451 Restlessness and agitation: Secondary | ICD-10-CM

## 2021-01-26 DIAGNOSIS — Z66 Do not resuscitate: Secondary | ICD-10-CM | POA: Diagnosis present

## 2021-01-26 DIAGNOSIS — Z7189 Other specified counseling: Secondary | ICD-10-CM | POA: Diagnosis not present

## 2021-01-26 DIAGNOSIS — I700 Atherosclerosis of aorta: Secondary | ICD-10-CM | POA: Diagnosis present

## 2021-01-26 DIAGNOSIS — T17908A Unspecified foreign body in respiratory tract, part unspecified causing other injury, initial encounter: Secondary | ICD-10-CM | POA: Diagnosis present

## 2021-01-26 DIAGNOSIS — F419 Anxiety disorder, unspecified: Secondary | ICD-10-CM | POA: Diagnosis present

## 2021-01-26 DIAGNOSIS — E441 Mild protein-calorie malnutrition: Secondary | ICD-10-CM

## 2021-01-26 DIAGNOSIS — E049 Nontoxic goiter, unspecified: Secondary | ICD-10-CM | POA: Diagnosis present

## 2021-01-26 DIAGNOSIS — K449 Diaphragmatic hernia without obstruction or gangrene: Secondary | ICD-10-CM | POA: Diagnosis present

## 2021-01-26 DIAGNOSIS — Z888 Allergy status to other drugs, medicaments and biological substances status: Secondary | ICD-10-CM | POA: Diagnosis not present

## 2021-01-26 DIAGNOSIS — S52021A Displaced fracture of olecranon process without intraarticular extension of right ulna, initial encounter for closed fracture: Secondary | ICD-10-CM | POA: Diagnosis present

## 2021-01-26 DIAGNOSIS — Z79899 Other long term (current) drug therapy: Secondary | ICD-10-CM | POA: Diagnosis not present

## 2021-01-26 DIAGNOSIS — R0600 Dyspnea, unspecified: Secondary | ICD-10-CM | POA: Diagnosis present

## 2021-01-26 DIAGNOSIS — F0390 Unspecified dementia without behavioral disturbance: Secondary | ICD-10-CM | POA: Diagnosis present

## 2021-01-26 DIAGNOSIS — I10 Essential (primary) hypertension: Secondary | ICD-10-CM | POA: Diagnosis present

## 2021-01-26 DIAGNOSIS — Z515 Encounter for palliative care: Secondary | ICD-10-CM

## 2021-01-26 DIAGNOSIS — F32A Depression, unspecified: Secondary | ICD-10-CM | POA: Diagnosis present

## 2021-01-26 DIAGNOSIS — Z20822 Contact with and (suspected) exposure to covid-19: Secondary | ICD-10-CM | POA: Diagnosis present

## 2021-01-26 DIAGNOSIS — K458 Other specified abdominal hernia without obstruction or gangrene: Secondary | ICD-10-CM | POA: Diagnosis present

## 2021-01-26 DIAGNOSIS — R1310 Dysphagia, unspecified: Secondary | ICD-10-CM | POA: Diagnosis present

## 2021-01-26 DIAGNOSIS — Z950 Presence of cardiac pacemaker: Secondary | ICD-10-CM | POA: Diagnosis not present

## 2021-01-26 LAB — BASIC METABOLIC PANEL
Anion gap: 9 (ref 5–15)
BUN: 13 mg/dL (ref 8–23)
CO2: 29 mmol/L (ref 22–32)
Calcium: 8.9 mg/dL (ref 8.9–10.3)
Chloride: 106 mmol/L (ref 98–111)
Creatinine, Ser: 0.46 mg/dL (ref 0.44–1.00)
GFR, Estimated: >60 mL/min (ref >60)
Glucose, Bld: 73 mg/dL (ref 70–99)
Potassium: 3.8 mmol/L (ref 3.5–5.1)
Sodium: 144 mmol/L (ref 135–145)

## 2021-01-26 LAB — CBC
HCT: 42.2 % (ref 36.0–46.0)
Hemoglobin: 14.2 g/dL (ref 12.0–15.0)
MCH: 31.5 pg (ref 26.0–34.0)
MCHC: 33.6 g/dL (ref 30.0–36.0)
MCV: 93.6 fL (ref 80.0–100.0)
Platelets: 230 10*3/uL (ref 150–400)
RBC: 4.51 MIL/uL (ref 3.87–5.11)
RDW: 13.5 % (ref 11.5–15.5)
WBC: 8.4 10*3/uL (ref 4.0–10.5)
nRBC: 0 % (ref 0.0–0.2)

## 2021-01-26 MED ORDER — ADULT MULTIVITAMIN W/MINERALS CH
1.0000 | ORAL_TABLET | Freq: Every day | ORAL | Status: DC
  Filled 2021-01-26: qty 1

## 2021-01-26 NOTE — Progress Notes (Addendum)
Initial Nutrition Assessment  DOCUMENTATION CODES:   Not applicable  INTERVENTION:   Ensure Enlive po TID, each supplement provides 350 kcal and 20 grams of protein  Magic cup TID with meals, each supplement provides 290 kcal and 9 grams of protein  MVI po daily   Pt at high refeed risk; recommend monitor potassium, magnesium and phosphorus labs daily until stable  NUTRITION DIAGNOSIS:   Inadequate oral intake related to dysphagia as evidenced by other (comment) (per chart review).  GOAL:   Patient will meet greater than or equal to 90% of their needs  MONITOR:   PO intake, Supplement acceptance, Labs, Weight trends, Skin, I & O's  REASON FOR ASSESSMENT:   Consult Assessment of nutrition requirement/status  ASSESSMENT:   87 y.o. female with medical history significant of dementia, depression, anxiety, hearing loss, hypertension, CKD II and nonverbal at baseline who is brought to the emergency department via EMS due to developing dyspnea after eating dinner at the facility.  RD working remotely.  Unable to speak with pt as pt is non-verbal at baseline r/t dementia. Pt admitted with choking and possible aspiration at SNF. Pt seen by SLP and placed on a dysphagia 1/thin liquid diet. Pt is noted to have a large hiatal hernia. RD will add supplements and MVI to help pt meet her estimated needs. There is no new weight from this admission. There is no documented weight history in chart to confirm if any recent significant weight changes. RD will obtain NFPE at follow up.   Medications reviewed and include: lovenox, protonix, paxil, LRS @50ml /hr  Labs reviewed:   NUTRITION - FOCUSED PHYSICAL EXAM: Unable to perform at this time   Diet Order:   Diet Order             DIET - DYS 1 Room service appropriate? Yes; Fluid consistency: Thin  Diet effective now                  EDUCATION NEEDS:   No education needs have been identified at this time  Skin:  Skin  Assessment: Reviewed RN Assessment (ecchymosis)  Last BM:  pta  Height:   Ht Readings from Last 1 Encounters:  01/22/21 5\' 7"  (1.702 m)    Weight:   Wt Readings from Last 1 Encounters:  01/22/21 80 kg    Ideal Body Weight:  61.3 kg  BMI:  There is no height or weight on file to calculate BMI.  Estimated Nutritional Needs:   Kcal:  1600-1800kcal/day  Protein:  80-90g/day  Fluid:  1.5-1.8L/day  MS, RD, LDN Please refer to [facility] for RD and/or RD on-call/weekend/after hours pager

## 2021-01-26 NOTE — Evaluation (Signed)
Physical Therapy Evaluation ( 1x Eval)  Patient Details Name: Casey Lam MRN: [id number] DOB: Jun 23, 1925 Today's Date: 01/26/2021  History of Present Illness  Patient is a 87 year old female who presented to hosptial from memory care unit after shortness of breath developed after eating dinner. patient was found to haveaspiration into airway and hiatal hernia. GI consulted, patient is not a surgical canditate at this time.  PMH: HTN, depression, dementia, CKD stage II, UTI, R olecranon fx.  Clinical Impression  Pt cooperative and verbal during our sessions with smiles and appropriate interaction at times. Pt did much better when I was able to pull my mask down and she could read my lips. Pt sat edge of bed with no assistance but close supervision for about 7-10 minutes while drinking water, and eating graham crackers. Pt seemed to have wanted to walk and get up however when attempted pt with greater difficulty sustaining an upright posture for walking. Unfortunately did not transfer pt to recliner because I was unsure if she would be safe and not want to get up out of the recliner at this time.   Daughter mentioned she participates in activities at the memory care and she eats her food with her fingers , however uses a spoon for ice cream events.   Feel pt is at baseline and would like be best back in her comfortable environment and caregivers at [facility] unit. She has been there for 3 years now.    No further PT needs at this time due to pt unable to participate in skilled pt , pt at baseline. Encourage nursing to get patient up to recliner and out of bed daily with ModA squat pivot.      Recommendations for follow up therapy are one component of a multi-disciplinary discharge planning process, led by the attending physician.  Recommendations may be updated based on patient status, additional functional criteria and insurance authorization.  Follow Up Recommendations No PT follow  up    Equipment Recommendations       Recommendations for Other Services       Precautions / Restrictions Precautions Precautions: Fall Restrictions Weight Bearing Restrictions: No      Mobility  Bed Mobility Overal bed mobility: Needs Assistance Bed Mobility: Supine to Sit;Sit to Supine     Supine to sit: HOB elevated;Mod assist Sit to supine: Mod assist;HOB elevated   General bed mobility comments: patient required increased multimodal cues to preform supine to sit on edge of bed. Pt is in a new environment and not sure what we are asking of her, so it is difficult    Transfers Overall transfer level: Needs assistance   Transfers: Sit to/from Stand Sit to Stand: Max assist;+2 safety/equipment;+2 physical assistance         General transfer comment: patient repeatdly asked to go into hallway with patient max A x 2 for standing with knees blocked and leaning to R side. patient unable to stand long enough to take steps.  Ambulation/Gait                Stairs            Wheelchair Mobility    Modified Rankin (Stroke Patients Only)       Balance Overall balance assessment: Needs assistance Sitting-balance support: Feet supported Sitting balance-Leahy Scale: Good     Standing balance support: Bilateral upper extremity supported Standing balance-Leahy Scale: Zero  Pertinent Vitals/Pain Pain Assessment: No/denies pain    Home Living Family/patient expects to be discharged to:: Skilled nursing facility (She is from [facility] care unit)                      Prior Function Level of Independence: Needs assistance   Gait / Transfers Assistance Needed: Pt sits edge ofbed and transfers to chair and WC with staff at [facility]  ADL's / Homemaking Assistance Needed: patient has TD for toileting,bathing,and dressing tasks at [facility] memory care unit. patient;s daughter reported via phone to PT that  patient was able to engage in self feeding with finger foods at memory care. patient was only noted to use spoon eating ice cream.        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 Pt reads lips)  Cognition Arousal/Alertness: Awake/alert Behavior During Therapy: Flat affect Overall Cognitive Status: History of cognitive impairments - at baseline                                 General Comments: patient s a long term care resident at memory care unit. patient has been living in memory care unit for 3 years per daughter report. patient was noted to be [facility] and have visual deficits as well impacting perceived behaviors.      General Comments      Exercises     Assessment/Plan    PT Assessment Patient needs continued PT services;Patent does not need any further PT services  PT Problem List         PT Treatment Interventions      PT Goals (Current goals can be found in the Care Plan section)  Acute Rehab PT Goals PT Goal Formulation: All assessment and education complete, DC therapy    Frequency     Barriers to discharge        Co-evaluation PT/OT/SLP Co-Evaluation/Treatment: Yes Reason for Co-Treatment: Necessary to address cognition/behavior during functional activity PT goals addressed during session: Mobility/safety with mobility OT goals addressed during session: ADL's and self-care       AM-PAC PT "6 Clicks" Mobility  Outcome Measure Help needed turning from your back to your side while in a flat bed without using bedrails?: A Lot Help needed moving from lying on your back to sitting on the side of a flat bed without using bedrails?: A Lot Help needed moving to  and from a bed to a chair (including a wheelchair)?: A Lot Help needed standing up from a chair using your arms (e.g., wheelchair or bedside chair)?: A Lot Help needed to walk in hospital room?: Total Help needed climbing 3-5 steps with a railing? : Total 6 Click Score: 10    End of Session   Activity Tolerance: Patient tolerated treatment well Patient left: in bed;with call bell/phone within reach;with bed alarm set;with nursing/sitter in room (hand mits as well) Nurse Communication: Mobility status PT Visit Diagnosis: Other abnormalities of gait and mobility (R26.89)    Time: 7989-2119 PT Time Calculation (min) (ACUTE ONLY): 32 min   Charges:   PT Evaluation $PT Eval Low Complexity: 1 Low  Clois Dupes, PT, MPT Acute Rehabilitation Services Office: [phone] Pager: [phone] 01/26/2021   Marella Bile 01/26/2021, 3:22 PM

## 2021-01-26 NOTE — Progress Notes (Addendum)
Civil engineer, contracting [facility]) Hospital Liaison Note  Received request for hospice services at [facility] after discharge. Chart and patient information reviewed by [facility] physician. Hospice eligibility confirmed.   Spoke with daughter [name] to initiate education related to hospice philosophy, services and team approach to care. Patient/family verbalized understanding of information provided.  DME needs discussed. Patient/family requests the following equipment for delivery: hospital bed, fall mat, w/c. Address has been verified and is correct in the chart. Charmaine Delford Field is the family contact to arrange time of equipment delivery.   Please send signed and completed DNR home with patient/family. Please provide prescriptions at discharge as needed to ensure ongoing symptom management.   ACC information and contact numbers given to family. Above information shared with [facility] Manager.   Please do not hesitate to call with any hospice related questions or concerns.   Thank you for the opportunity to participate in this patient's care.   Bobbie "Einar Gip, RN, BSN [facility] Liaison [phone]

## 2021-01-26 NOTE — Progress Notes (Signed)
Pt has pulled her IV out. Attempted to restart w 3+ assistance of staff, pt kicking, scratching and trying to bite staff. MD notified of above.

## 2021-01-26 NOTE — Consult Note (Signed)
Palliative Care Consult Note                                  Date: 01/26/2021   Patient Name: Casey Lam  DOB: 10/16/1925  MRN: [id number]  Age / Sex: 87 y.o., female  PCP: Monico Blitz, NP Referring Physician: Shelly Coss, MD  Reason for Consultation: Establishing goals of care  HPI/Patient Profile: 87 y.o. female  with past medical history of dementia, depression, dysphagia, anxiety, hypertension admitted on 01/24/2021 from SNF/Memory Care with cc of dyspnea and concerns for aspiration. She was combative in the ED and required diazepam/haldol. Admitted for severe dysphagia work-up. Imaging documented large hiatal hernia not a candidate for surgery.  However, chest imaging do not document any clear pneumonia.  Speech therapy consulted and recommended dysphagia 1 diet.  Baseline dementia, deconditioning, debility from [facility] with plan to discharge back to there.  She is a DNR.  She is deemed a very high risk for aspiration pneumonia and mortality given large hiatal hernia.  She is essentially nonverbal at this point.  PMT was consulted for goals of care.  Past Medical History:  Diagnosis Date   Dementia ([place]) 07/18/2018   Depression    Hearing loss 07/18/2018   Hypertension 07/18/2018    Subjective:   This NP Walden Field reviewed medical records, received report from team, assessed the patient and then meet at the patient's bedside to discuss diagnosis, prognosis, GOC, EOL wishes disposition and options.  I met with the patient at bedside, essentially non-verbal and non-communicative although she is awake and alert. I spoke with the patient's daughter Ree Shay on the telephone.   Concept of Palliative Care was introduced as specialized medical care for people and their families living with serious illness.  If focuses on providing relief from the symptoms and stress of a serious illness.  The goal is to improve  quality of life for both the patient and the family. Values and goals of care important to patient and family were attempted to be elicited.  Created space and opportunity for patient  and family to explore thoughts and feelings regarding current medical situation   Natural trajectory and current clinical status were discussed. Questions and concerns addressed. Patient  encouraged to call with questions or concerns.    Patient/Family Understanding of Illness: The patient is not able to communicate.  The patient's daughter understands that she was having choking on food at her memory care and shortness of breath for which she was brought to the hospital and felt likely aspiration pneumonia.  She passed a swallow study.  They are giving her fluids and antibiotics.  However, she was told that the patient is "fighting and biting" and will not eat.   Life Review: The patient's daughter states that her mom is married and 66 years old and had 6 kids.  She has been a lifelong homemaker.  She describes a somewhat antisocial and not wanting to go out and interact much.  However, she does enjoy gardening, knitting, sewing.  Her husband passed in 2009 after a brief stay at residential hospice.  Since then her daughter is taken over her care and her mom lived with her until 2019 with the onset of the pandemic when she was placed in memory care.  She describes her mom's poor quality life.  She states her mom does enjoy ice cream.  Today's Discussion: The patient's daughter  describes her mother is frail and elderly, deaf, blind in the right eye.  She was diagnosed with dementia in her 41s and has had some slow progressive decline.  She notes that she has had significant functional and cognitive decline over the years and is minimally functional, completely incontinent, noncommunicative.  She was previously feeding herself but only eating with her fingers.  However, she may not be feeding herself much recently.     She notes that her mom has a poor quality of life.  I explained hiatal hernia and how this increases risk for aspiration.  Discussed feeding precautions including 5-6 small meals a day, dysphagia 1 diet recommendations per dietitian.  However, I explained that she will remain a high risk for aspiration pneumonia and high risk for mortality.  She verbalized understanding.  I offered support to her.  At the end of discussion, we had a talk about possible options.  The patient did eventually elect hospice in place at [facility].  Review of Systems  Unable to perform ROS: Mental status change   Objective:   Primary Diagnoses: Present on Admission:  Aspiration into airway  Hypertension  Dementia ([place])  Anxiety with depression  Hyperglycemia  Mild protein malnutrition ([place])  Fracture of olecranon process of right ulna   Physical Exam Vitals and nursing note reviewed.  Constitutional:      General: She is awake.     Appearance: She is normal weight. She is ill-appearing. She is not toxic-appearing.     Comments: Non-communicative  HENT:     Head: Normocephalic and atraumatic.  Cardiovascular:     Rate and Rhythm: Normal rate and regular rhythm.  Pulmonary:     Effort: Pulmonary effort is normal. No respiratory distress.     Breath sounds: No wheezing or rhonchi.  Abdominal:     General: Abdomen is flat.     Palpations: Abdomen is soft.  Skin:    General: Skin is warm and dry.  Neurological:     Mental Status: She is alert.     Comments: Non-communicative  Psychiatric:        Behavior: Behavior normal.    Vital Signs:  BP 117/83 (BP Location: Right Arm)   Pulse 87   Temp 98.9 F (37.2 C) (Oral)   Resp 16   SpO2 99%   Palliative Assessment/Data: 20-30%    Advanced Care Planning:   Primary Decision Maker: NEXT OF KIN  Code Status/Advance Care Planning: DNR  A discussion was had today regarding advanced directives. Concepts specific to code status,  artifical feeding and hydration, continued IV antibiotics and rehospitalization was had.  The difference between a aggressive medical intervention path and a palliative comfort care path for this patient at this time was had. The MOST form was noted to be previously completed. We reviewed MOST form elections and she confirmed this to still be current/true.  Decisions/Changes to ACP: No changes Remain DNR Decisions per MOST form  Assessment & Plan:   Impression: 87 year old female with end-stage dementia admitted with shortness of breath likely aspiration pneumonia given newly discovered incidental large hiatal hernia.  She is at high risk for aspiration pneumonia and high mortality risk.  She is nonverbal, has had significant functional and cognitive decline over the years.  She is no longer able to care for herself and resides at [facility].  She describes having poor quality of life.  Her daughter is concerned that she is not enjoying her life and  may be suffering.  She eventually elects in place hospice at [facility].  SUMMARY OF RECOMMENDATIONS   Continue to treat the treatable Referral to [place] in place at [place] recommendations per SLP Goals of care are clear PMT will continue to support  Symptom Management:  Per primary team PMT available to assist as needed  Prognosis:  < 6 months  Discharge Planning:  [place] with Hospice   Discussed with: Dr. Suzan Nailer (pt daughter), Helene Kelp (RN), Barbaraann Rondo [facility] Colleague), Jolayne Haines [facility] liaison),     Thank you for allowing [place] to participate in the care of Khushboo Chuck PMT will continue to support holistically.  Time Total: 70 min  Greater than 50%  of this time was spent counseling and coordinating care related to the above assessment and plan.  Signed by: Walden Field, NP Palliative Medicine Team  Team Phone # [phone] (Nights/Weekends)  01/26/2021, 1:29 PM

## 2021-01-26 NOTE — Evaluation (Signed)
Occupational Therapy Evaluation Patient Details Name: Casey Lam MRN: [id number] DOB: 06-11-1925 Today's Date: 01/26/2021   History of Present Illness Patient is a 87 year old female who presented to hosptial from memory care unit after shortness of breath developed after eating dinner. patient was found to haveaspiration into airway and hiatal hernia. GI consulted, patient is not a surgical canditate at this time.  PMH: HTN, depression, dementia, CKD stage II, UTI, R olecranon fx.   Clinical Impression   Patient is a 87 year old female who was admitted for above. Currently patient appears to be at baseline per PT phone call with daughter this AM. Patient was able to engage in taking drinks of water sitting on edge of bed with min guard. Nursing staff was educated on how to engage with patient and patients participation level with therapy. Patient plans to transition back to memory care unit to continue long term care at time of d/c. OT signing off at this time.       Recommendations for follow up therapy are one component of a multi-disciplinary discharge planning process, led by the attending physician.  Recommendations may be updated based on patient status, additional functional criteria and insurance authorization.   Follow Up Recommendations  SNF;Supervision/Assistance - 24 hour (back to memory care unit)    Equipment Recommendations  None recommended by OT    Recommendations for Other Services       Precautions / Restrictions Precautions Precautions: Fall Restrictions Weight Bearing Restrictions: No      Mobility Bed Mobility Overal bed mobility: Needs Assistance Bed Mobility: Supine to Sit;Sit to Supine     Supine to sit: HOB elevated;Max assist Sit to supine: Mod assist;HOB elevated   General bed mobility comments: patient required increased multimodal cues to preform supine to sit on edge of bed.    Transfers Overall transfer level: Needs assistance    Transfers: Sit to/from Stand Sit to Stand: Max assist;+2 safety/equipment;+2 physical assistance         General transfer comment: patient repeatdly asked to go into hallway with patient max A x 2 for standing with knees blocked and leaning to R side. patient unable to stand long enough to take steps.    Balance Overall balance assessment: Needs assistance Sitting-balance support: Feet supported Sitting balance-Leahy Scale: Fair     Standing balance support: Bilateral upper extremity supported Standing balance-Leahy Scale: Zero                             ADL either performed or assessed with clinical judgement   ADL Overall ADL's : At baseline                                       General ADL Comments: patient was able to participate in sitting on edge of bed to engage in taking drinks of water and taking medicine from nurse off spoon with patient assisting with bringing spoon to mouth. patient was able to maintain sitting balance on edge of bed with min guard with BLE close to floor. patient was noted to have good strength in BUE with asssit to pull up to sit on edge of bed. patient lives at memory care unit with caregiver support for all ADL tasks at baseline.     Vision Baseline Vision/History: 2 Legally blind Additional Comments: patient has visual deficits  in R eye.     Perception     Praxis      Pertinent Vitals/Pain Pain Assessment: No/denies pain     Hand Dominance Right   Extremity/Trunk Assessment Upper Extremity Assessment Upper Extremity Assessment: Overall WFL for tasks assessed   Lower Extremity Assessment Lower Extremity Assessment: Defer to PT evaluation       Communication Communication Communication: No difficulties   Cognition Arousal/Alertness: Awake/alert Behavior During Therapy: Flat affect Overall Cognitive Status: History of cognitive impairments - at baseline                                  General Comments: patient s a long term care resident at memory care unit. patient has been living in memory care unit for 3 years per daughter report. patient was noted to be [facility] and have visual deficits as well impacting perceived behaviors.   General Comments       Exercises     Shoulder Instructions      Home Living Family/patient expects to be discharged to:: Skilled nursing facility                                        Prior Functioning/Environment Level of Independence: Needs assistance    ADL's / Homemaking Assistance Needed: patient has TD for toileting,bathing,and dressing tasks at [facility] memory care unit. patient;s daughter reported via phone to PT that patient was able to engage in self feeding with finger foods at memory care. patient was only noted to use spoon eating ice cream.            OT Problem List: Decreased safety awareness      OT Treatment/Interventions:      OT Goals(Current goals can be found in the care plan section) Acute Rehab OT Goals OT Goal Formulation: All assessment and education complete, DC therapy  OT Frequency:     Barriers to D/C:            Co-evaluation PT/OT/SLP Co-Evaluation/Treatment: Yes Reason for Co-Treatment: To address functional/ADL transfers PT goals addressed during session: Mobility/safety with mobility OT goals addressed during session: ADL's and self-care      AM-PAC OT "6 Clicks" Daily Activity     Outcome Measure Help from another person eating meals?: A Little Help from another person taking care of personal grooming?: A Lot Help from another person toileting, which includes using toliet, bedpan, or urinal?: Total Help from another person bathing (including washing, rinsing, drying)?: Total Help from another person to put on and taking off regular upper body clothing?: Total Help from another person to put on and taking off regular lower body clothing?: Total 6 Click Score: 9   End of  Session Nurse Communication: Other (comment) (nurse cleared patient to participate)  Activity Tolerance: Patient tolerated treatment well Patient left: in bed;with call bell/phone within reach;with bed alarm set  OT Visit Diagnosis: Other symptoms and signs involving cognitive function;Unsteadiness on feet (R26.81)                Time: 8676-1950 OT Time Calculation (min): 32 min Charges:  OT General Charges $OT Visit: 1 Visit OT Evaluation $OT Eval Low Complexity: 1 Low  Sharyn Blitz OTR/L, MS Acute Rehabilitation Department Office# [phone] Pager# [phone]   [place] Guest Patriece Archbold 01/26/2021, 1:12 PM

## 2021-01-26 NOTE — Progress Notes (Signed)
PROGRESS NOTE    Casey Lam  ZYS:[id number] DOB: 1926/04/21 DOA: 01/24/2021 PCP: Angelica Pou, NP   Chief Complain: Shortness of breath    Brief Narrative:  Patient is a 87 year old female with history of dementia, depression, dysphagia, anxiety, hypertension who presented with shortness of breath after eating dinner.  There was concern for aspiration.  In the ED, she was hostile/combative and was given diazepam IV/Haldol.  She was admitted for dysphagia work-up.  Speech therapy consulted and recommended dysphagia 1 diet.  GI was also curbside consulted and recommended no intervention as her hiatal hernia was a likely cause for dysphagia not amenable to surgical repair secondary to poor candidacy.  Palliative care has also been consulted.  Assessment & Plan:   Principal Problem:   Aspiration into airway Active Problems:   Dementia (HCC)   Hypertension   Fracture of olecranon process of right ulna   Anxiety with depression   Hyperglycemia   Mild protein malnutrition (HCC)   Shortness of breath/aspiration: Presented with shortness of breath.  Chest imaging did not show any clear pneumonia, but showed large hiatal hernia. Speech therapy consulted, recommended dysphagia 1 diet.  Case was also discussed with GI, most likely her aspiration was related to hiatal hernia, she is not a candidate for hiatal hernia repair due to her age.  Palliative care has been consulted.  Currently on dysphagia 1 diet. Continue Protonix  Anxiety/depression: On BuSpar, Paxil as an outpatient.  Continue  Dementia: Patient is nonverbal.  Delirium precaution, avoid benzodiazepines, opiates  Hypertension: On atenolol at home  Mild malnutrition: Dietitian consulted and following.  Deconditioning/debility/goals of care: PT/OT consulted.  She is from [place]  place.  Plan is to discharge back there. Patient is a DNR.  She has high risk of developing aspiration pneumonia/high risk for mortality given her  large hiatal hernia which is not amenable to repair.  Palliative care consulted.        DVT prophylaxis:Lovenox Code Status: DNR Family Communication: Called daughter on the phone number on chart, call not received Status is: Observation    Dispo: The patient is from: SNF              Anticipated d/c is to: SNF              Patient currently is not medically stable to d/c.   Difficult to place patient No     Consultants: None  Procedures:None  Antimicrobials:  Anti-infectives (From admission, onward)    None       Subjective:  Patient seen and examined at the bedside this morning.  Comfortable.  Hemodynamically stable.  She was in mittens.  Mildly agitated, communicates but is not oriented   Objective: Vitals:   01/25/21 1545 01/25/21 1732 01/25/21 2131 01/26/21 0602  BP: (!) 146/68 (!) 145/66 (!) 168/95 117/83  Pulse: 61 68 84 87  Resp:  14 16 16   Temp:  (!) 97.5 F (36.4 C) (!) 97.5 F (36.4 C) 98.9 F (37.2 C)  TempSrc:  Oral Oral Oral  SpO2:  94% 95% 99%    Intake/Output Summary (Last 24 hours) at 01/26/2021 0948 Last data filed at 01/26/2021 0600 Gross per 24 hour  Intake 400 ml  Output 1800 ml  Net -1400 ml   There were no vitals filed for this visit.  Examination:  General exam: Overall comfortable, not in distress, deconditioned, debilitated HEENT: PERRL Respiratory system:  no wheezes or crackles  Cardiovascular system: S1 & S2 heard, RRR.  Gastrointestinal system: Abdomen is nondistended, soft and nontender. Central nervous system: Alert and awake but not oriented  extremities: No edema, no clubbing ,no cyanosis, mittens on hands Skin: No rashes, no ulcers,no icterus     Data Reviewed: I have personally reviewed following labs and imaging studies  CBC: Recent Labs  Lab 01/24/21 1933 01/24/21 1944 01/26/21 0427  WBC 8.6  --  8.4  NEUTROABS 5.5  --   --   HGB 13.6 12.9 14.2  HCT 43.0 38.0 42.2  MCV 97.9  --  93.6  PLT 227  --   230   Basic Metabolic Panel: Recent Labs  Lab 01/24/21 1933 01/24/21 1944 01/26/21 0427  NA 140 141 144  K 4.2 3.8 3.8  CL 105 105 106  CO2 27  --  29  GLUCOSE 150* 147* 73  BUN 23 21 13   CREATININE 0.79 0.80 0.46  CALCIUM 8.8*  --  8.9   GFR: Estimated Creatinine Clearance: 48.9 mL/min (by C-G formula based on SCr of 0.46 mg/dL). Liver Function Tests: Recent Labs  Lab 01/24/21 1933  AST 25  ALT 14  ALKPHOS 91  BILITOT 0.5  PROT 6.8  ALBUMIN 3.4*   No results for input(s): LIPASE, AMYLASE in the last 168 hours. No results for input(s): AMMONIA in the last 168 hours. Coagulation Profile: No results for input(s): INR, PROTIME in the last 168 hours. Cardiac Enzymes: No results for input(s): CKTOTAL, CKMB, CKMBINDEX, TROPONINI in the last 168 hours. BNP (last 3 results) No results for input(s): PROBNP in the last 8760 hours. HbA1C: No results for input(s): HGBA1C in the last 72 hours. CBG: No results for input(s): GLUCAP in the last 168 hours. Lipid Profile: No results for input(s): CHOL, HDL, LDLCALC, TRIG, CHOLHDL, LDLDIRECT in the last 72 hours. Thyroid Function Tests: No results for input(s): TSH, T4TOTAL, FREET4, T3FREE, THYROIDAB in the last 72 hours. Anemia Panel: No results for input(s): VITAMINB12, FOLATE, FERRITIN, TIBC, IRON, RETICCTPCT in the last 72 hours. Sepsis Labs: No results for input(s): PROCALCITON, LATICACIDVEN in the last 168 hours.  Recent Results (from the past 240 hour(s))  Resp Panel by RT-PCR (Flu A&B, Covid) Nasopharyngeal Swab     Status: None   Collection Time: 01/24/21  7:33 PM   Specimen: Nasopharyngeal Swab; Nasopharyngeal(NP) swabs in vial transport medium  Result Value Ref Range Status   SARS Coronavirus 2 by RT PCR NEGATIVE NEGATIVE Final    Comment: (NOTE) SARS-CoV-2 target nucleic acids are NOT DETECTED.  The SARS-CoV-2 RNA is generally detectable in upper respiratory specimens during the acute phase of infection. The  lowest concentration of SARS-CoV-2 viral copies this assay can detect is 138 copies/mL. A negative result does not preclude SARS-Cov-2 infection and should not be used as the sole basis for treatment or other patient management decisions. A negative result may occur with  improper specimen collection/handling, submission of specimen other than nasopharyngeal swab, presence of viral mutation(s) within the areas targeted by this assay, and inadequate number of viral copies(<138 copies/mL). A negative result must be combined with clinical observations, patient history, and epidemiological information. The expected result is Negative.  Fact Sheet for Patients:  01/26/21  Fact Sheet for Healthcare Providers:  BloggerCourse.com  This test is no t yet approved or cleared by the [place] FDA and  has been authorized for detection and/or diagnosis of SARS-CoV-2 by FDA under an Emergency Use Authorization (EUA). This EUA will remain  in effect (meaning this test can be used)  for the duration of the COVID-19 declaration under Section 564(b)(1) of the Act, 21 U.S.C.section 360bbb-3(b)(1), unless the authorization is terminated  or revoked sooner.       Influenza A by PCR NEGATIVE NEGATIVE Final   Influenza B by PCR NEGATIVE NEGATIVE Final    Comment: (NOTE) The Xpert Xpress SARS-CoV-2/FLU/RSV plus assay is intended as an aid in the diagnosis of influenza from Nasopharyngeal swab specimens and should not be used as a sole basis for treatment. Nasal washings and aspirates are unacceptable for Xpert Xpress SARS-CoV-2/FLU/RSV testing.  Fact Sheet for Patients: BloggerCourse.com  Fact Sheet for Healthcare Providers: SeriousBroker.it  This test is not yet approved or cleared by the [place] FDA and has been authorized for detection and/or diagnosis of SARS-CoV-2 by FDA under  an Emergency Use Authorization (EUA). This EUA will remain in effect (meaning this test can be used) for the duration of the COVID-19 declaration under Section 564(b)(1) of the Act, 21 U.S.C. section 360bbb-3(b)(1), unless the authorization is terminated or revoked.  Performed at [facility], [address], [place], [place] [address]          Radiology Studies: CT Angio Chest PE W and/or Wo Contrast  Result Date: 01/24/2021 CLINICAL DATA:  Shortness of breath. EXAM: CT ANGIOGRAPHY CHEST WITH CONTRAST TECHNIQUE: Multidetector CT imaging of the chest was performed using the standard protocol during bolus administration of intravenous contrast. Multiplanar CT image reconstructions and MIPs were obtained to evaluate the vascular anatomy. CONTRAST:  7mL OMNIPAQUE IOHEXOL 350 MG/ML SOLN COMPARISON:  August 19, 2019 FINDINGS: Cardiovascular: There is a multi lead AICD. Moderate severity calcification of the aortic arch and marked severity calcification of the descending thoracic aorta is noted. Satisfactory opacification of the pulmonary arteries to the segmental level. No evidence of pulmonary embolism. Normal heart size with moderate severity coronary artery calcification. No pericardial effusion. Mediastinum/Nodes: No enlarged mediastinal, hilar, or axillary lymph nodes. The right and left lobes of the thyroid gland are markedly enlarged, with inferior extension of the right lobe of the thyroid gland into the substernal region. This is stable in appearance when compared to the prior study. Subsequent deviation of the trachea is noted. The esophagus demonstrate no significant findings. Lungs/Pleura: Mild atelectatic changes are seen within the bilateral lower lobes. This is slightly more prominent along the posterior aspects of the bilateral lung bases. There is no evidence of a pleural effusion or pneumothorax. Upper Abdomen: Very large, stable gastric hernia is seen.  Musculoskeletal: Degenerative changes are noted throughout the thoracic spine. Review of the MIP images confirms the above findings. IMPRESSION: 1. No evidence of pulmonary embolus. 2. Mild bilateral lower lobe atelectatic changes. 3. Very large, stable gastric hernia. 4. Stable substernal goiter. In the setting of significant comorbidities or limited life expectancy, no follow-up recommended (ref: J Am Coll Radiol. 2015 Feb;12(2): 143-50). 5. Aortic atherosclerosis. Aortic Atherosclerosis (ICD10-I70.0). Electronically Signed   By: Aram Candela M.D.   On: 01/24/2021 22:08   DG Chest Port 1 View  Result Date: 01/24/2021 CLINICAL DATA:  Difficulty breathing. EXAM: PORTABLE CHEST 1 VIEW COMPARISON:  January 22, 2021 FINDINGS: A multi lead AICD is noted. Decreased lung volumes are seen without evidence of acute infiltrate, pleural effusion or pneumothorax. The cardiac silhouette is borderline in size and unchanged in appearance. Chronic right-sided rib fractures are noted. Degenerative changes seen throughout the thoracic spine. IMPRESSION: Shallow inspiration without evidence of acute or active cardiopulmonary disease. Electronically Signed   By: Waylan Rocher  Houston M.D.   On: 01/24/2021 20:13        Scheduled Meds:  atenolol  25 mg Oral Daily   busPIRone  10 mg Oral 3 times per day   enoxaparin (LOVENOX) injection  40 mg Subcutaneous Q24H   feeding supplement  237 mL Oral TID BM   pantoprazole  20 mg Oral Daily   PARoxetine  30 mg Oral Q1200   Continuous Infusions:  lactated ringers       LOS: 0 days    Time spent:25 mins, More than 50% of that time was spent in counseling and/or coordination of care.      Burnadette Pop, MD [facility] P9/17/2022, 9:48 AM

## 2021-01-26 NOTE — Progress Notes (Signed)
SLP Cancellation Note  Patient Details Name: Casey Lam MRN: [id number] DOB: 31-Aug-1925   Cancelled treatment:       Reason Eval/Treat Not Completed: Other (comment) (pt agitated with RN, pulled out IV, will continue efforts)  Rolena Infante, MS [facility] SLP Acute Rehab Services Office [phone] Pager [phone]  Chales Abrahams 01/26/2021, 0700 PM

## 2021-01-27 LAB — RESP PANEL BY RT-PCR (FLU A&B, COVID) ARPGX2
Influenza A by PCR: NEGATIVE
Influenza B by PCR: NEGATIVE
SARS Coronavirus 2 by RT PCR: NEGATIVE

## 2021-01-27 NOTE — NC FL2 (Signed)
[place] MEDICAID FL2 LEVEL OF CARE SCREENING TOOL     IDENTIFICATION  Patient Name: Casey Lam Birthdate: 1926-03-23 Sex: female Admission Date (Current Location): 01/24/2021  [facility] and [place] Number:  Producer, television/film/video and Address:   [facility] - [address] [place], [place] [address])      Provider Number: [id number]  Attending Physician Name and Address:  Burnadette Pop, MD  Relative Name and Phone Number:  Lynford Citizen - Daughter # [phone]    Current Level of Care: Hospital Recommended Level of Care: Assisted Living Facility Prior Approval Number:    Date Approved/Denied:   PASRR Number:    Discharge Plan: Other (Comment) [facility])    Current Diagnoses: Patient Active Problem List   Diagnosis Date Noted   Aspiration into airway 01/24/2021   Anxiety with depression 01/24/2021   Hyperglycemia 01/24/2021   Mild protein malnutrition (HCC) 01/24/2021   Dementia (HCC) 07/18/2018   Hypertension 07/18/2018   UTI (urinary tract infection) 07/18/2018   Acute renal failure superimposed on stage 2 chronic kidney disease (HCC) 07/18/2018   Fracture of olecranon process of right ulna 07/18/2018   Fall 07/18/2018    Orientation RESPIRATION BLADDER Height & Weight        Normal Incontinent Weight:   Height:     BEHAVIORAL SYMPTOMS/MOOD NEUROLOGICAL BOWEL NUTRITION STATUS      Incontinent Diet (Dysphasia)  AMBULATORY STATUS COMMUNICATION OF NEEDS Skin   Total Care Non-Verbally Normal                       Personal Care Assistance Level of Assistance  Total care       Total Care Assistance: Maximum assistance   Functional Limitations Info  Sight, Speech (Non Verbal) Sight Info: Impaired   Speech Info: Impaired    SPECIAL CARE FACTORS FREQUENCY                       Contractures Contractures Info: Not present    Additional Factors Info  Code Status Code Status Info: DNR             Current Medications  (01/27/2021):  This is the current hospital active medication list Current Facility-Administered Medications  Medication Dose Route Frequency Provider Last Rate Last Admin   acetaminophen (TYLENOL) tablet 650 mg  650 mg Oral Q6H PRN Bobette Mo, MD   650 mg at 01/26/21 2059   Or   acetaminophen (TYLENOL) suppository 650 mg  650 mg Rectal Q6H PRN Bobette Mo, MD       atenolol (TENORMIN) tablet 25 mg  25 mg Oral Daily Narda Bonds, MD   25 mg at 01/26/21 1130   busPIRone (BUSPAR) tablet 10 mg  10 mg Oral 3 times per day Narda Bonds, MD   10 mg at 01/26/21 2059   enoxaparin (LOVENOX) injection 40 mg  40 mg Subcutaneous Q24H Bobette Mo, MD   40 mg at 01/26/21 1130   feeding supplement (ENSURE ENLIVE / ENSURE PLUS) liquid 237 mL  237 mL Oral TID BM Narda Bonds, MD   237 mL at 01/26/21 2059   lactated ringers infusion   Intravenous Continuous Bobette Mo, MD 50 mL/hr at 01/26/21 1747 New Bag at 01/26/21 1747   multivitamin with minerals tablet 1 tablet  1 tablet Oral Daily Adhikari, Amrit, MD       ondansetron (ZOFRAN) tablet 4 mg  4 mg Oral  Q6H PRN Bobette Mo, MD       Or   ondansetron [facility]) injection 4 mg  4 mg Intravenous Q6H PRN Bobette Mo, MD       pantoprazole (PROTONIX) EC tablet 20 mg  20 mg Oral Daily Narda Bonds, MD   20 mg at 01/26/21 1130   PARoxetine (PAXIL) tablet 30 mg  30 mg Oral Q1200 Narda Bonds, MD   30 mg at 01/26/21 1130     Discharge Medications: Please see discharge summary for a list of discharge medications.  Relevant Imaging Results:  Relevant Lab Results:   Additional Information    Johnita Palleschi, Vinnie Langton, LCSW

## 2021-01-27 NOTE — TOC Transition Note (Addendum)
Transition of Care [facility]) - CM/SW Discharge Note   Patient Details  Name: Casey Lam MRN: [id number] Date of Birth: [birth year]  Transition of Care [facility]) CM/SW Contact:  Ernie Kasler, Vinnie Langton, LCSW Phone Number: 01/27/2021, 11:50 AM   Clinical Narrative:      LCSW contacted the charge nurse at [facility] to confirm that patient is able to return to their facility today (01/27/2021).  LCSW has also notified [place], representative with Hospice Services through [employer].  Karen Kitchens agreed to notify LCSW as soon as the hospital bed, fall mat and wheelchair have been ordered through [employer] and delivered to [facility], prior to patient's return.  LCSW will arrange non-emergency ambulance transport through [facility] at time of discharge.  Discharge Summary and completed and signed FL-2 Form faxed to [facility] at [facility].  HIPAA compliant message left on voicemail for patient's daughter, Lynford Citizen explaining patient's discharge plan of care.   Patient Goals and CMS Choice    Discharge Placement          [facility]        Discharge Plan and Services      Return to [facility] with Hospice Services through [employer].     Social Determinants of Health (SDOH) Interventions     Readmission Risk Interventions No flowsheet data found.  Danford Bad, BSW, MSW, [employer]  Licensed Visual merchandiser [employer]  Mailing Address-[address], [place], [place] [address] Physical Address-[address] [place], [place] [address] Toll Free Main # [phone] Fax # [phone] Cell # [phone]  [email or web address]

## 2021-01-27 NOTE — Progress Notes (Signed)
Report called to Rashida at [facility]. Awaiting [facility] transport back to facility.

## 2021-01-27 NOTE — Discharge Summary (Signed)
Physician Discharge Summary  Casey Lam ZOX:[id number] DOB: 1925/12/13 DOA: 01/24/2021  PCP: Angelica Pou, NP  Admit date: 01/24/2021 Discharge date: 01/27/2021  Admitted From: Nursing facility Disposition:  Nursing facility with hospice  Discharge Condition:Stable CODE STATUS:DNR Diet recommendation: Dysphagia 1 diet  Brief/Interim Summary:  Patient is a 87 year old female with history of dementia, depression, dysphagia, anxiety, hypertension who presented with shortness of breath after eating dinner.  There was concern for aspiration.  In the ED, she was hostile/combative and was given diazepam IV/Haldol.  She was admitted for dysphagia work-up.  Speech therapy consulted and recommended dysphagia 1 diet.  GI was also curbside consulted and recommended no intervention as her hiatal hernia was a likely cause for dysphagia not amenable to surgical repair secondary to poor candidacy.  Palliative care were consulted.  After discussion with family, decision was made to discharge her back to [facility] with hospice services.  Following problems were addressed during her hospitalization:  Shortness of breath/aspiration: Presented with shortness of breath.  Chest imaging did not show any clear pneumonia, but showed large hiatal hernia. Speech therapy consulted, recommended dysphagia 1 diet.  Case was also discussed with GI, most likely her aspiration was related to hiatal hernia, she is not a candidate for hiatal hernia repair due to her age.  Palliative care was consulted.  Currently on dysphagia 1 diet. Continue Protonix. Currently she is on room air   Anxiety/depression: On BuSpar, Paxil as an outpatient.  Continue   Dementia: Patient is nonverbal.  Avoid benzodiazepines, opiates   Hypertension: On atenolol ,BP stable   Mild malnutrition: Dietitian consulted and  were following.   Deconditioning/debility/goals of care: Marland Kitchen  [facility] is [facility].Patient is a DNR.  She has high  risk of developing aspiration pneumonia/high risk for mortality given her large hiatal hernia which is not amenable to repair.  Palliative care were consulted.  After discussion with family, decision was made to discharge her back to the nursing facility with hospice services.    Discharge Instructions  Discharge Instructions     Diet general   Complete by: As directed    Dysphagia 1 diet   Discharge instructions   Complete by: As directed    1)Please follow up with Hospice services      Allergies as of 01/27/2021       Reactions   Lorazepam Other (See Comments)        Medication List     STOP taking these medications    senna-docusate 8.6-50 MG tablet Commonly known as: Senokot-S       TAKE these medications    acetaminophen 325 MG tablet Commonly known as: TYLENOL Take 2 tablets (650 mg total) by mouth every 6 (six) hours as needed for mild pain (or Fever >/= 101).   atenolol 25 MG tablet Commonly known as: TENORMIN Take 25 mg by mouth daily.   busPIRone 10 MG tablet Commonly known as: BUSPAR Take 10 mg by mouth 3 (three) times daily. At 11am/ 4pm / and 8pm   docusate 50 MG/5ML liquid Commonly known as: COLACE Take 50 mg by mouth daily. What changed: Another medication with the same name was removed. Continue taking this medication, and follow the directions you see here.   doxylamine (Sleep) 25 MG tablet Commonly known as: UNISOM Take 12.5 mg by mouth at bedtime.   feeding supplement Liqd Take 237 mLs by mouth 2 (two) times daily between meals. What changed:  when to take this Another  medication with the same name was removed. Continue taking this medication, and follow the directions you see here.   loperamide 2 MG tablet Commonly known as: IMODIUM A-D Take 2-4 mg by mouth See admin instructions. Take 2 tablets by mouth for first dose, then 1 tablet after each loose stool as needed.   methocarbamol 500 MG tablet Commonly known as: ROBAXIN Take  1 tablet (500 mg total) by mouth every 8 (eight) hours as needed for muscle spasms.   pantoprazole 20 MG tablet Commonly known as: PROTONIX Take 20 mg by mouth daily.   PARoxetine 30 MG tablet Commonly known as: PAXIL Take 30 mg by mouth daily at 12 noon.        Allergies  Allergen Reactions   Lorazepam Other (See Comments)    Consultations: Palliative care   Procedures/Studies: CT Angio Chest PE W and/or Wo Contrast  Result Date: 01/24/2021 CLINICAL DATA:  Shortness of breath. EXAM: CT ANGIOGRAPHY CHEST WITH CONTRAST TECHNIQUE: Multidetector CT imaging of the chest was performed using the standard protocol during bolus administration of intravenous contrast. Multiplanar CT image reconstructions and MIPs were obtained to evaluate the vascular anatomy. CONTRAST:  22mL OMNIPAQUE IOHEXOL 350 MG/ML SOLN COMPARISON:  August 19, 2019 FINDINGS: Cardiovascular: There is a multi lead AICD. Moderate severity calcification of the aortic arch and marked severity calcification of the descending thoracic aorta is noted. Satisfactory opacification of the pulmonary arteries to the segmental level. No evidence of pulmonary embolism. Normal heart size with moderate severity coronary artery calcification. No pericardial effusion. Mediastinum/Nodes: No enlarged mediastinal, hilar, or axillary lymph nodes. The right and left lobes of the thyroid gland are markedly enlarged, with inferior extension of the right lobe of the thyroid gland into the substernal region. This is stable in appearance when compared to the prior study. Subsequent deviation of the trachea is noted. The esophagus demonstrate no significant findings. Lungs/Pleura: Mild atelectatic changes are seen within the bilateral lower lobes. This is slightly more prominent along the posterior aspects of the bilateral lung bases. There is no evidence of a pleural effusion or pneumothorax. Upper Abdomen: Very large, stable gastric hernia is seen.  Musculoskeletal: Degenerative changes are noted throughout the thoracic spine. Review of the MIP images confirms the above findings. IMPRESSION: 1. No evidence of pulmonary embolus. 2. Mild bilateral lower lobe atelectatic changes. 3. Very large, stable gastric hernia. 4. Stable substernal goiter. In the setting of significant comorbidities or limited life expectancy, no follow-up recommended (ref: J Am Coll Radiol. 2015 Feb;12(2): 143-50). 5. Aortic atherosclerosis. Aortic Atherosclerosis (ICD10-I70.0). Electronically Signed   By: Aram Candela M.D.   On: 01/24/2021 22:08   DG Chest Port 1 View  Result Date: 01/24/2021 CLINICAL DATA:  Difficulty breathing. EXAM: PORTABLE CHEST 1 VIEW COMPARISON:  January 22, 2021 FINDINGS: A multi lead AICD is noted. Decreased lung volumes are seen without evidence of acute infiltrate, pleural effusion or pneumothorax. The cardiac silhouette is borderline in size and unchanged in appearance. Chronic right-sided rib fractures are noted. Degenerative changes seen throughout the thoracic spine. IMPRESSION: Shallow inspiration without evidence of acute or active cardiopulmonary disease. Electronically Signed   By: Aram Candela M.D.   On: 01/24/2021 20:13   DG Chest Port 1 View  Result Date: 01/22/2021 CLINICAL DATA:  Evaluation for aspiration. EXAM: PORTABLE CHEST 1 VIEW.  Patient is markedly rotated. COMPARISON:  Chest x-ray 09/17/2019, CT chest 08/19/2019 FINDINGS: Similar-appearing 2 lead pacemaker in stable position. The heart and mediastinal contours are unchanged. Aortic  calcification. Hiatal hernia. Similar appearance of the right lung compared to chest x-ray 09/17/2019. Question retrocardiac airspace opacity. No pulmonary edema. No pleural effusion. No pneumothorax. No acute osseous abnormality. IMPRESSION: 1. Question retrocardiac airspace opacity. With limited evaluation due to low lung volumes and patient positioning/rotation. 2. Hiatal hernia.  Electronically Signed   By: Tish Frederickson M.D.   On: 01/22/2021 20:01      Subjective: Patient seen and examined at the bedside this morning.  Hemodynamically stable.  She was sleeping, looked comfortable.  Not in any kind of distress. I tried to call her daughter  Charmaine multiple times, call not received  Discharge Exam: Vitals:   01/26/21 2146 01/27/21 0634  BP: (!) 147/72 (!) 154/73  Pulse: 69 67  Resp: 15 17  Temp: 98.5 F (36.9 C) 97.7 F (36.5 C)  SpO2: 97% 97%   Vitals:   01/26/21 0602 01/26/21 1352 01/26/21 2146 01/27/21 0634  BP: 117/83 (!) 154/65 (!) 147/72 (!) 154/73  Pulse: 87 78 69 67  Resp: 16 17 15 17   Temp: 98.9 F (37.2 C) 98.8 F (37.1 C) 98.5 F (36.9 C) 97.7 F (36.5 C)  TempSrc: Oral Axillary Oral   SpO2: 99% 100% 97% 97%    General: Pt is sleeping,drowsy,not in distress,confused Cardiovascular: RRR, S1/S2 +, no rubs, no gallops Respiratory: CTA bilaterally, no wheezing, no rhonchi Abdominal: Soft, NT, ND Extremities: no edema, no cyanosis    The results of significant diagnostics from this hospitalization (including imaging, microbiology, ancillary and laboratory) are listed below for reference.     Microbiology: Recent Results (from the past 240 hour(s))  Resp Panel by RT-PCR (Flu A&B, Covid) Nasopharyngeal Swab     Status: None   Collection Time: 01/24/21  7:33 PM   Specimen: Nasopharyngeal Swab; Nasopharyngeal(NP) swabs in vial transport medium  Result Value Ref Range Status   SARS Coronavirus 2 by RT PCR NEGATIVE NEGATIVE Final    Comment: (NOTE) SARS-CoV-2 target nucleic acids are NOT DETECTED.  The SARS-CoV-2 RNA is generally detectable in upper respiratory specimens during the acute phase of infection. The lowest concentration of SARS-CoV-2 viral copies this assay can detect is 138 copies/mL. A negative result does not preclude SARS-Cov-2 infection and should not be used as the sole basis for treatment or other patient  management decisions. A negative result may occur with  improper specimen collection/handling, submission of specimen other than nasopharyngeal swab, presence of viral mutation(s) within the areas targeted by this assay, and inadequate number of viral copies(<138 copies/mL). A negative result must be combined with clinical observations, patient history, and epidemiological information. The expected result is Negative.  Fact Sheet for Patients:  01/26/21  Fact Sheet for Healthcare Providers:  BloggerCourse.com  This test is no t yet approved or cleared by the [place] FDA and  has been authorized for detection and/or diagnosis of SARS-CoV-2 by FDA under an Emergency Use Authorization (EUA). This EUA will remain  in effect (meaning this test can be used) for the duration of the COVID-19 declaration under Section 564(b)(1) of the Act, 21 U.S.C.section 360bbb-3(b)(1), unless the authorization is terminated  or revoked sooner.       Influenza A by PCR NEGATIVE NEGATIVE Final   Influenza B by PCR NEGATIVE NEGATIVE Final    Comment: (NOTE) The Xpert Xpress SARS-CoV-2/FLU/RSV plus assay is intended as an aid in the diagnosis of influenza from Nasopharyngeal swab specimens and should not be used as a sole basis for treatment. Nasal washings and  aspirates are unacceptable for Xpert Xpress SARS-CoV-2/FLU/RSV testing.  Fact Sheet for Patients: BloggerCourse.com  Fact Sheet for Healthcare Providers: [employer]ious[employer]ker[employer]it  This test is not yet approved or cleared by the [place] FDA and has been authorized for detection and/or diagnosis of SARS-CoV-2 by FDA under an Emergency Use Authorization (EUA). This EUA will remain in effect (meaning this test can be used) for the duration of the COVID-19 declaration under Section 564(b)(1) of the Act, 21 U.S.C. section 360bbb-3(b)(1),  unless the authorization is terminated or revoked.  Performed at [facility], [address], [place], [place] [address]      Labs: BNP (last 3 results) No results for input(s): BNP in the last 8760 hours. Basic Metabolic Panel: Recent Labs  Lab 01/24/21 1933 01/24/21 1944 01/26/21 0427  NA 140 141 144  K 4.2 3.8 3.8  CL 105 105 106  CO2 27  --  29  GLUCOSE 150* 147* 73  BUN 23 21 13   CREATININE 0.79 0.80 0.46  CALCIUM 8.8*  --  8.9   Liver Function Tests: Recent Labs  Lab 01/24/21 1933  AST 25  ALT 14  ALKPHOS 91  BILITOT 0.5  PROT 6.8  ALBUMIN 3.4*   No results for input(s): LIPASE, AMYLASE in the last 168 hours. No results for input(s): AMMONIA in the last 168 hours. CBC: Recent Labs  Lab 01/24/21 1933 01/24/21 1944 01/26/21 0427  WBC 8.6  --  8.4  NEUTROABS 5.5  --   --   HGB 13.6 12.9 14.2  HCT 43.0 38.0 42.2  MCV 97.9  --  93.6  PLT 227  --  230   Cardiac Enzymes: No results for input(s): CKTOTAL, CKMB, CKMBINDEX, TROPONINI in the last 168 hours. BNP: Invalid input(s): POCBNP CBG: No results for input(s): GLUCAP in the last 168 hours. D-Dimer No results for input(s): DDIMER in the last 72 hours. Hgb A1c No results for input(s): HGBA1C in the last 72 hours. Lipid Profile No results for input(s): CHOL, HDL, LDLCALC, TRIG, CHOLHDL, LDLDIRECT in the last 72 hours. Thyroid function studies No results for input(s): TSH, T4TOTAL, T3FREE, THYROIDAB in the last 72 hours.  Invalid input(s): FREET3 Anemia work up No results for input(s): VITAMINB12, FOLATE, FERRITIN, TIBC, IRON, RETICCTPCT in the last 72 hours. Urinalysis    Component Value Date/Time   COLORURINE YELLOW 08/13/2019 1635   APPEARANCEUR HAZY (A) 08/13/2019 1635   LABSPEC 1.020 08/13/2019 1635   PHURINE 6.0 08/13/2019 1635   GLUCOSEU NEGATIVE 08/13/2019 1635   HGBUR NEGATIVE 08/13/2019 1635   BILIRUBINUR NEGATIVE 08/13/2019 1635   KETONESUR NEGATIVE 08/13/2019  1635   PROTEINUR NEGATIVE 08/13/2019 1635   NITRITE POSITIVE (A) 08/13/2019 1635   LEUKOCYTESUR LARGE (A) 08/13/2019 1635   Sepsis Labs Invalid input(s): PROCALCITONIN,  WBC,  LACTICIDVEN Microbiology Recent Results (from the past 240 hour(s))  Resp Panel by RT-PCR (Flu A&B, Covid) Nasopharyngeal Swab     Status: None   Collection Time: 01/24/21  7:33 PM   Specimen: Nasopharyngeal Swab; Nasopharyngeal(NP) swabs in vial transport medium  Result Value Ref Range Status   SARS Coronavirus 2 by RT PCR NEGATIVE NEGATIVE Final    Comment: (NOTE) SARS-CoV-2 target nucleic acids are NOT DETECTED.  The SARS-CoV-2 RNA is generally detectable in upper respiratory specimens during the acute phase of infection. The lowest concentration of SARS-CoV-2 viral copies this assay can detect is 138 copies/mL. A negative result does not preclude SARS-Cov-2 infection and should not be used as the  sole basis for treatment or other patient management decisions. A negative result may occur with  improper specimen collection/handling, submission of specimen other than nasopharyngeal swab, presence of viral mutation(s) within the areas targeted by this assay, and inadequate number of viral copies(<138 copies/mL). A negative result must be combined with clinical observations, patient history, and epidemiological information. The expected result is Negative.  Fact Sheet for Patients:  BloggerCourse.com  Fact Sheet for Healthcare Providers:  [employer]  This test is no t yet approved or cleared by the [place] FDA and  has been authorized for detection and/or diagnosis of SARS-CoV-2 by FDA under an Emergency Use Authorization (EUA). This EUA will remain  in effect (meaning this test can be used) for the duration of the COVID-19 declaration under Section 564(b)(1) of the Act, 21 U.S.C.section 360bbb-3(b)(1), unless the authorization is terminated   or revoked sooner.       Influenza A by PCR NEGATIVE NEGATIVE Final   Influenza B by PCR NEGATIVE NEGATIVE Final    Comment: (NOTE) The Xpert Xpress SARS-CoV-2/FLU/RSV plus assay is intended as an aid in the diagnosis of influenza from Nasopharyngeal swab specimens and should not be used as a sole basis for treatment. Nasal washings and aspirates are unacceptable for Xpert Xpress SARS-CoV-2/FLU/RSV testing.  Fact Sheet for Patients: BloggerCourse.com  Fact Sheet for Healthcare Providers: [employer]ious[employer]ker[employer]it  This test is not yet approved or cleared by the [place] FDA and has been authorized for detection and/or diagnosis of SARS-CoV-2 by FDA under an Emergency Use Authorization (EUA). This EUA will remain in effect (meaning this test can be used) for the duration of the COVID-19 declaration under Section 564(b)(1) of the Act, 21 U.S.C. section 360bbb-3(b)(1), unless the authorization is terminated or revoked.  Performed at [facility], [address] [place], [place] [address]     Please note: You were cared for by a hospitalist during your hospital stay. Once you are discharged, your primary care physician will handle any further medical issues. Please note that NO REFILLS for any discharge medications will be authorized once you are discharged, as it is imperative that you return to your primary care physician (or establish a relationship with a primary care physician if you do not have one) for your post hospital discharge needs so that they can reassess your need for medications and monitor your lab values.    Time coordinating discharge: 40 minutes  SIGNED:   Burnadette Pop, MD  [facility] 01/27/2021, 9:41 AM Pager [phone]  If 7PM-7AM, please contact night-coverage [email or web address] Password [email or web address]

## 2021-09-09 DEATH — deceased

## 2022-01-31 IMAGING — CR DG ELBOW COMPLETE 3+V*R*
6 series · 6 of 6 positions shown · non-contrast
Comparison: 09/06/2018

CLINICAL DATA: Laceration, limited range of motion

EXAM:
RIGHT ELBOW - COMPLETE 3+ VIEW

[x elbow lat right (1 of 2)]
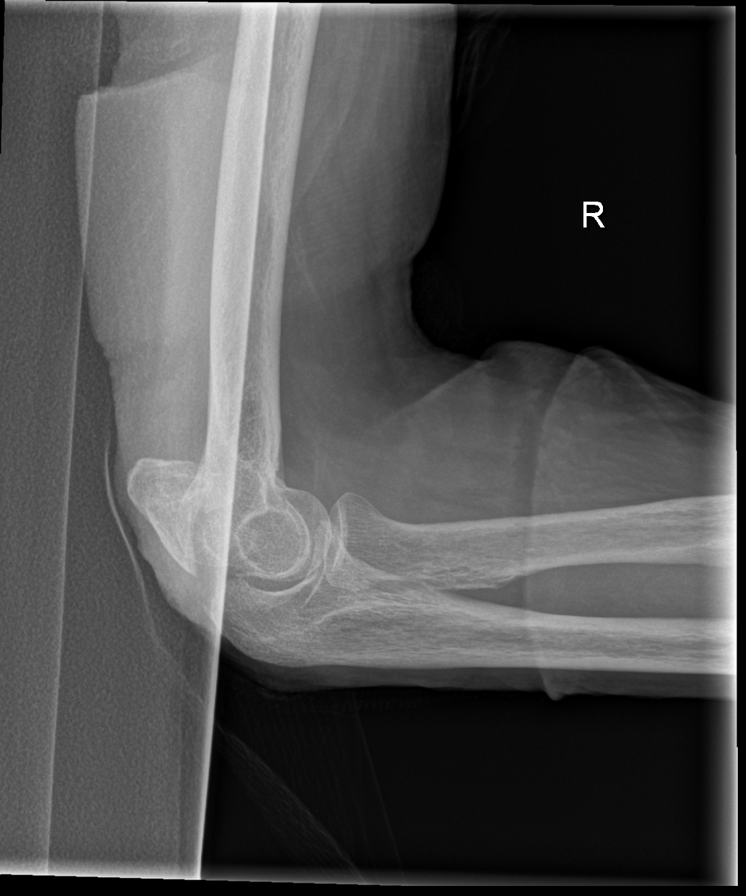

[x elbow ap right]
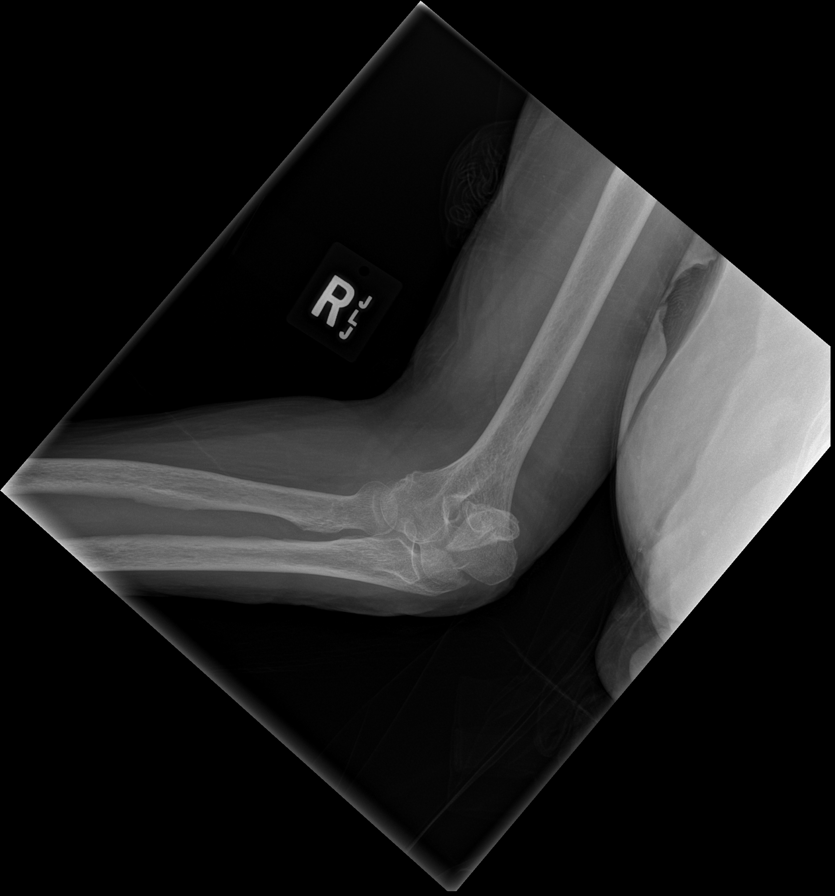

[x elbow obl right (1 of 2)]
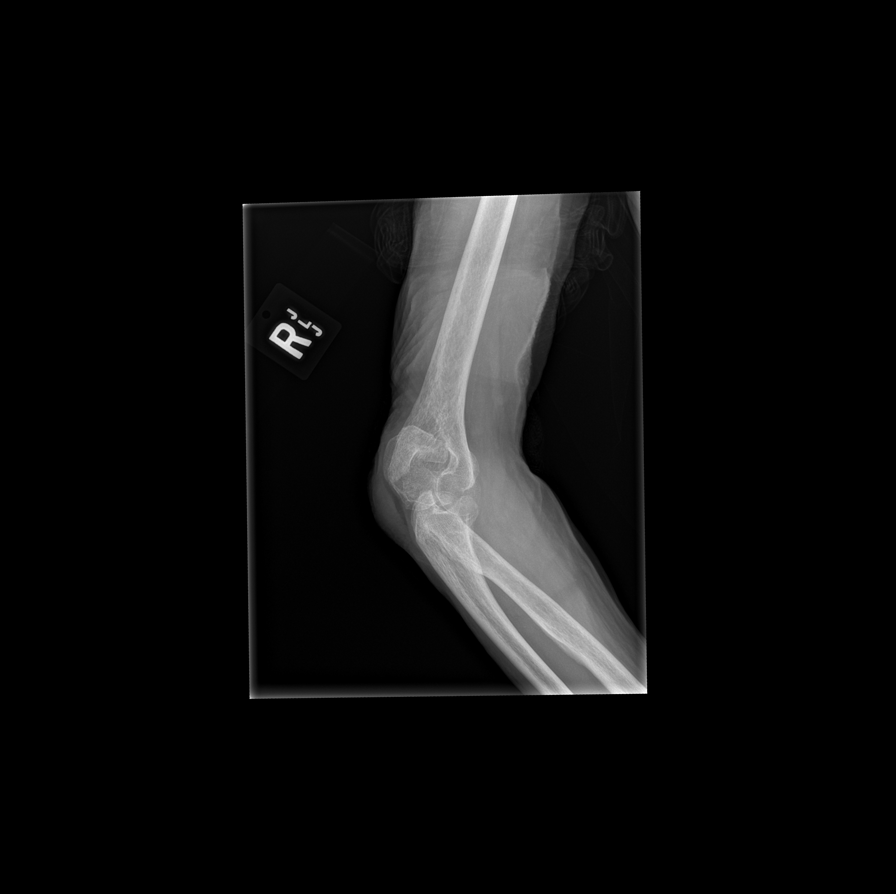

[x elbow obl right (2 of 2)]
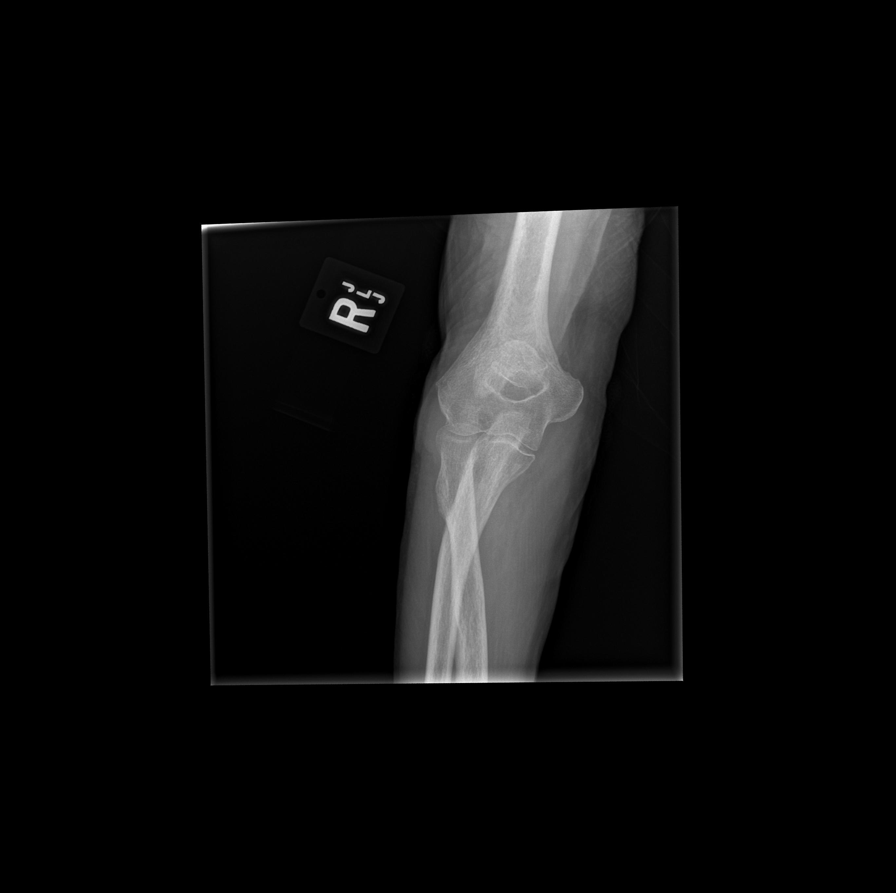

[x elbow right 0-3yrs]
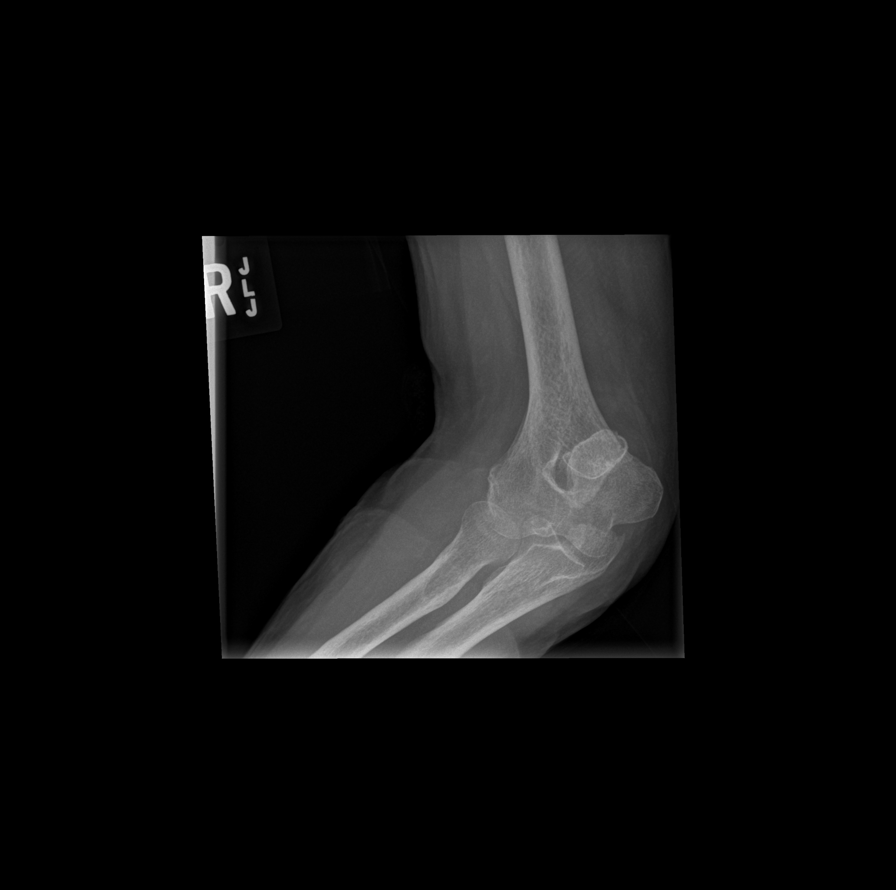

[x elbow lat right (2 of 2)]
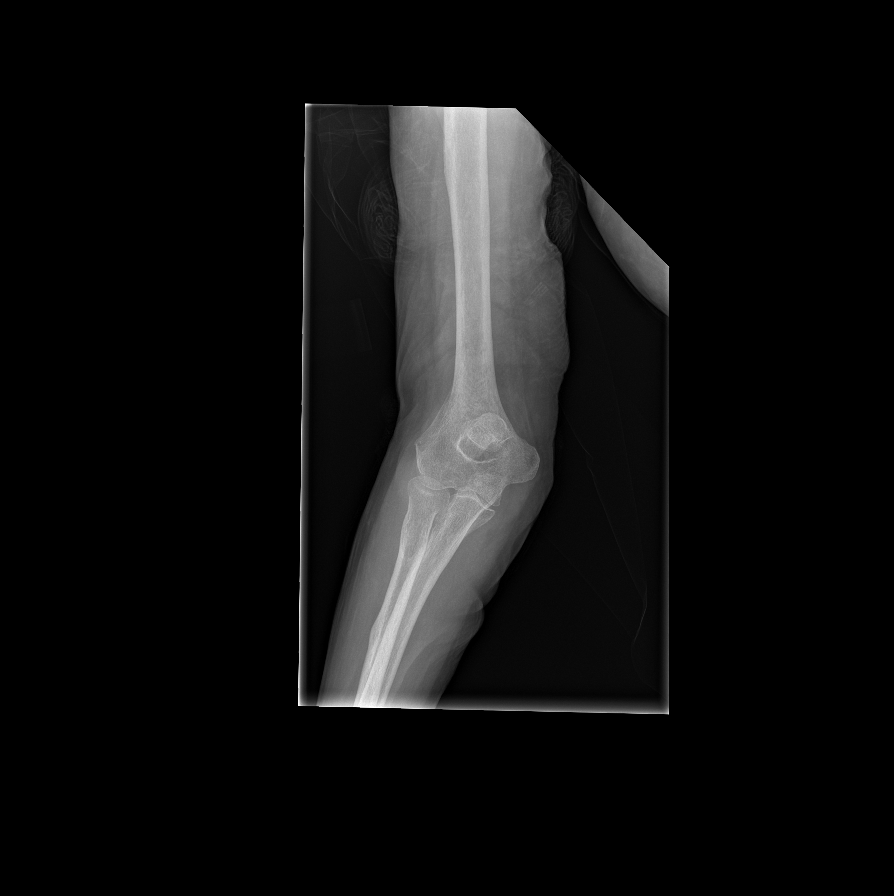

[6 of 6 positions shown; findings below may reference images not displayed]

FINDINGS: Frontal, bilateral oblique, lateral views of the right elbow are
obtained. The distracted olecranon fracture seen previously is
unchanged in position. No evidence of dislocation.

No new bony abnormalities. Joint spaces are relatively well
preserved. Mild dorsal soft tissue swelling over the olecranon.
IMPRESSION: 1. Stable distracted olecranon fracture.
2. Soft tissue edema.
3. No new bony abnormality.
